# Patient Record
Sex: Female | Born: 1955 | Race: Black or African American | Hispanic: No | Marital: Married | Smoking: Never smoker
Health system: Southern US, Community
[De-identification: ages and names within clinical notes are randomized; demographics above are authoritative.]

## PROBLEM LIST (undated history)

## (undated) DIAGNOSIS — K219 Gastro-esophageal reflux disease without esophagitis: Secondary | ICD-10-CM

## (undated) DIAGNOSIS — I341 Nonrheumatic mitral (valve) prolapse: Secondary | ICD-10-CM

## (undated) DIAGNOSIS — J0100 Acute maxillary sinusitis, unspecified: Secondary | ICD-10-CM

## (undated) DIAGNOSIS — G479 Sleep disorder, unspecified: Secondary | ICD-10-CM

## (undated) DIAGNOSIS — I10 Essential (primary) hypertension: Secondary | ICD-10-CM

## (undated) DIAGNOSIS — F329 Major depressive disorder, single episode, unspecified: Secondary | ICD-10-CM

## (undated) DIAGNOSIS — F419 Anxiety disorder, unspecified: Secondary | ICD-10-CM

## (undated) DIAGNOSIS — M1990 Unspecified osteoarthritis, unspecified site: Secondary | ICD-10-CM

## (undated) DIAGNOSIS — S83289A Other tear of lateral meniscus, current injury, unspecified knee, initial encounter: Secondary | ICD-10-CM

## (undated) DIAGNOSIS — Z87442 Personal history of urinary calculi: Secondary | ICD-10-CM

## (undated) DIAGNOSIS — E7800 Pure hypercholesterolemia, unspecified: Secondary | ICD-10-CM

## (undated) DIAGNOSIS — F32A Depression, unspecified: Secondary | ICD-10-CM

## (undated) DIAGNOSIS — K120 Recurrent oral aphthae: Secondary | ICD-10-CM

## (undated) DIAGNOSIS — E669 Obesity, unspecified: Secondary | ICD-10-CM

## (undated) HISTORY — DX: Recurrent oral aphthae: K12.0

## (undated) HISTORY — DX: Pure hypercholesterolemia, unspecified: E78.00

## (undated) HISTORY — DX: Sleep disorder, unspecified: G47.9

## (undated) HISTORY — PX: LYSIS OF ADHESION: SHX5961

## (undated) HISTORY — DX: Acute maxillary sinusitis, unspecified: J01.00

## (undated) HISTORY — DX: Essential (primary) hypertension: I10

## (undated) HISTORY — DX: Nonrheumatic mitral (valve) prolapse: I34.1

## (undated) HISTORY — PX: KIDNEY STONE SURGERY: SHX686

## (undated) HISTORY — DX: Obesity, unspecified: E66.9

## (undated) HISTORY — PX: COLONOSCOPY: SHX174

## (undated) HISTORY — PX: TUBAL LIGATION: SHX77

## (undated) HISTORY — DX: Gastro-esophageal reflux disease without esophagitis: K21.9

---

## 1984-09-08 HISTORY — PX: ABDOMINAL HYSTERECTOMY: SHX81

## 2008-12-22 ENCOUNTER — Encounter: Admission: RE | Admit: 2008-12-22 | Discharge: 2008-12-22 | Payer: Self-pay | Admitting: Internal Medicine

## 2009-01-11 ENCOUNTER — Encounter: Admission: RE | Admit: 2009-01-11 | Discharge: 2009-01-11 | Payer: Self-pay | Admitting: Internal Medicine

## 2009-01-15 ENCOUNTER — Ambulatory Visit (HOSPITAL_COMMUNITY): Admission: RE | Admit: 2009-01-15 | Discharge: 2009-01-15 | Payer: Self-pay | Admitting: Internal Medicine

## 2009-01-16 ENCOUNTER — Emergency Department (HOSPITAL_COMMUNITY): Admission: EM | Admit: 2009-01-16 | Discharge: 2009-01-16 | Payer: Self-pay | Admitting: Emergency Medicine

## 2009-05-28 ENCOUNTER — Emergency Department (HOSPITAL_COMMUNITY): Admission: EM | Admit: 2009-05-28 | Discharge: 2009-05-29 | Payer: Self-pay | Admitting: Emergency Medicine

## 2009-06-13 ENCOUNTER — Ambulatory Visit (HOSPITAL_COMMUNITY): Admission: RE | Admit: 2009-06-13 | Discharge: 2009-06-13 | Payer: Self-pay | Admitting: Gastroenterology

## 2009-07-12 ENCOUNTER — Encounter: Admission: RE | Admit: 2009-07-12 | Discharge: 2009-07-12 | Payer: Self-pay | Admitting: Surgery

## 2009-09-08 HISTORY — PX: CHOLECYSTECTOMY: SHX55

## 2010-07-27 ENCOUNTER — Emergency Department (HOSPITAL_COMMUNITY): Admission: EM | Admit: 2010-07-27 | Discharge: 2010-07-28 | Payer: Self-pay | Admitting: Emergency Medicine

## 2010-09-19 ENCOUNTER — Other Ambulatory Visit
Admission: RE | Admit: 2010-09-19 | Discharge: 2010-09-19 | Payer: Self-pay | Source: Home / Self Care | Admitting: Internal Medicine

## 2010-09-23 ENCOUNTER — Encounter: Admission: RE | Admit: 2010-09-23 | Payer: Self-pay | Source: Home / Self Care | Admitting: Internal Medicine

## 2010-09-27 ENCOUNTER — Encounter
Admission: RE | Admit: 2010-09-27 | Discharge: 2010-09-27 | Payer: Self-pay | Source: Home / Self Care | Attending: Internal Medicine | Admitting: Internal Medicine

## 2010-10-09 ENCOUNTER — Encounter: Payer: Self-pay | Admitting: Internal Medicine

## 2010-12-13 LAB — HEPATIC FUNCTION PANEL
Albumin: 4 g/dL (ref 3.5–5.2)
Indirect Bilirubin: 0.5 mg/dL (ref 0.3–0.9)
Total Protein: 7.7 g/dL (ref 6.0–8.3)

## 2010-12-13 LAB — LIPASE, BLOOD: Lipase: 22 U/L (ref 11–59)

## 2010-12-13 LAB — POCT CARDIAC MARKERS
CKMB, poc: <1 ng/mL — ABNORMAL LOW (ref 1.0–8.0)
Troponin i, poc: <0.05 ng/mL (ref 0.00–0.09)
Troponin i, poc: <0.05 ng/mL (ref 0.00–0.09)

## 2010-12-13 LAB — BASIC METABOLIC PANEL
BUN: 16 mg/dL (ref 6–23)
Chloride: 101 mEq/L (ref 96–112)
Creatinine, Ser: 0.76 mg/dL (ref 0.4–1.2)
Glucose, Bld: 87 mg/dL (ref 70–99)
Potassium: 3.9 mEq/L (ref 3.5–5.1)

## 2010-12-13 LAB — CBC
HCT: 48.7 % — ABNORMAL HIGH (ref 36.0–46.0)
MCV: 85.3 fL (ref 78.0–100.0)
Platelets: 206 10*3/uL (ref 150–400)
RDW: 13.7 % (ref 11.5–15.5)

## 2010-12-13 LAB — DIFFERENTIAL
Basophils Absolute: 0.1 10*3/uL (ref 0.0–0.1)
Basophils Relative: 1 % (ref 0–1)
Eosinophils Absolute: 0.1 10*3/uL (ref 0.0–0.7)
Eosinophils Relative: 1 % (ref 0–5)
Neutrophils Relative %: 58 % (ref 43–77)

## 2010-12-13 LAB — APTT: aPTT: 25 seconds (ref 24–37)

## 2010-12-13 LAB — PROTIME-INR
INR: 1 (ref 0.00–1.49)
Prothrombin Time: 12.7 seconds (ref 11.6–15.2)

## 2010-12-17 LAB — POCT I-STAT, CHEM 8
Calcium, Ion: 1.18 mmol/L (ref 1.12–1.32)
Glucose, Bld: 99 mg/dL (ref 70–99)
HCT: 55 % — ABNORMAL HIGH (ref 36.0–46.0)
Hemoglobin: 18.7 g/dL — ABNORMAL HIGH (ref 12.0–15.0)
Potassium: 4.3 mEq/L (ref 3.5–5.1)

## 2010-12-17 LAB — DIFFERENTIAL
Basophils Relative: 1 % (ref 0–1)
Eosinophils Absolute: 0.1 10*3/uL (ref 0.0–0.7)
Lymphs Abs: 1.7 10*3/uL (ref 0.7–4.0)
Neutrophils Relative %: 61 % (ref 43–77)

## 2010-12-17 LAB — CBC
MCV: 84.7 fL (ref 78.0–100.0)
Platelets: 216 10*3/uL (ref 150–400)
WBC: 5.8 10*3/uL (ref 4.0–10.5)

## 2010-12-17 LAB — URINALYSIS, ROUTINE W REFLEX MICROSCOPIC
Bilirubin Urine: NEGATIVE
Nitrite: NEGATIVE
Specific Gravity, Urine: 1.013 (ref 1.005–1.030)
Urobilinogen, UA: 0.2 mg/dL (ref 0.0–1.0)

## 2010-12-25 ENCOUNTER — Encounter: Payer: Self-pay | Admitting: Internal Medicine

## 2011-02-19 ENCOUNTER — Other Ambulatory Visit: Payer: Self-pay | Admitting: Internal Medicine

## 2011-02-19 ENCOUNTER — Ambulatory Visit
Admission: RE | Admit: 2011-02-19 | Discharge: 2011-02-19 | Disposition: A | Discharge status: Home / Self Care | Payer: BC Managed Care – PPO | Source: Ambulatory Visit | Attending: Internal Medicine | Admitting: Internal Medicine

## 2011-02-19 DIAGNOSIS — J329 Chronic sinusitis, unspecified: Secondary | ICD-10-CM

## 2011-05-20 ENCOUNTER — Other Ambulatory Visit: Payer: Self-pay | Admitting: Internal Medicine

## 2011-05-20 DIAGNOSIS — R1010 Upper abdominal pain, unspecified: Secondary | ICD-10-CM

## 2011-05-23 ENCOUNTER — Ambulatory Visit
Admission: RE | Admit: 2011-05-23 | Discharge: 2011-05-23 | Disposition: A | Discharge status: Home / Self Care | Payer: BC Managed Care – PPO | Source: Ambulatory Visit | Attending: Internal Medicine | Admitting: Internal Medicine

## 2011-05-23 DIAGNOSIS — R1010 Upper abdominal pain, unspecified: Secondary | ICD-10-CM

## 2011-05-30 ENCOUNTER — Other Ambulatory Visit: Payer: Self-pay | Admitting: Internal Medicine

## 2011-06-02 ENCOUNTER — Ambulatory Visit
Admission: RE | Admit: 2011-06-02 | Discharge: 2011-06-02 | Disposition: A | Discharge status: Home / Self Care | Payer: BC Managed Care – PPO | Source: Ambulatory Visit | Attending: Internal Medicine | Admitting: Internal Medicine

## 2011-06-02 MED ORDER — IOHEXOL 300 MG/ML  SOLN: 75.0000 mL | Freq: Once | INTRAMUSCULAR | Status: AC | PRN

## 2012-01-08 LAB — LIPID PANEL
Cholesterol: 136 mg/dL (ref 0–200)
Triglycerides: 63 mg/dL (ref 40–160)

## 2012-01-08 LAB — TSH: TSH: 0.59 u[IU]/mL (ref <=5.90)

## 2012-09-08 HISTORY — PX: OTHER SURGICAL HISTORY: SHX169

## 2012-10-05 LAB — BASIC METABOLIC PANEL
Creatinine: 0.6 mg/dL (ref 0.5–1.1)
Sodium: 13 mmol/L — AB (ref 137–147)

## 2012-10-05 LAB — HEPATIC FUNCTION PANEL
Alkaline Phosphatase: 73 U/L (ref 25–125)
Bilirubin, Total: 0.3 mg/dL

## 2012-10-05 LAB — CBC AND DIFFERENTIAL: Platelets: 227 10*3/uL (ref 150–399)

## 2012-11-17 ENCOUNTER — Other Ambulatory Visit (HOSPITAL_COMMUNITY): Payer: Self-pay | Admitting: Cardiovascular Disease

## 2012-11-17 DIAGNOSIS — R079 Chest pain, unspecified: Secondary | ICD-10-CM

## 2012-11-17 DIAGNOSIS — I341 Nonrheumatic mitral (valve) prolapse: Secondary | ICD-10-CM

## 2012-11-25 ENCOUNTER — Emergency Department (HOSPITAL_COMMUNITY): Payer: 59

## 2012-11-25 ENCOUNTER — Encounter (HOSPITAL_COMMUNITY): Payer: Self-pay | Admitting: *Deleted

## 2012-11-25 ENCOUNTER — Emergency Department (HOSPITAL_COMMUNITY)
Admission: EM | Admit: 2012-11-25 | Discharge: 2012-11-25 | Disposition: A | Discharge status: Home / Self Care | Payer: 59 | Attending: Emergency Medicine | Admitting: Emergency Medicine

## 2012-11-25 DIAGNOSIS — M542 Cervicalgia: Secondary | ICD-10-CM | POA: Insufficient documentation

## 2012-11-25 DIAGNOSIS — S20219A Contusion of unspecified front wall of thorax, initial encounter: Secondary | ICD-10-CM | POA: Insufficient documentation

## 2012-11-25 DIAGNOSIS — IMO0002 Reserved for concepts with insufficient information to code with codable children: Secondary | ICD-10-CM | POA: Insufficient documentation

## 2012-11-25 DIAGNOSIS — S0081XA Abrasion of other part of head, initial encounter: Secondary | ICD-10-CM

## 2012-11-25 DIAGNOSIS — Z8639 Personal history of other endocrine, nutritional and metabolic disease: Secondary | ICD-10-CM | POA: Insufficient documentation

## 2012-11-25 DIAGNOSIS — Y9389 Activity, other specified: Secondary | ICD-10-CM | POA: Insufficient documentation

## 2012-11-25 DIAGNOSIS — Z862 Personal history of diseases of the blood and blood-forming organs and certain disorders involving the immune mechanism: Secondary | ICD-10-CM | POA: Insufficient documentation

## 2012-11-25 DIAGNOSIS — R209 Unspecified disturbances of skin sensation: Secondary | ICD-10-CM | POA: Insufficient documentation

## 2012-11-25 DIAGNOSIS — W2211XA Striking against or struck by driver side automobile airbag, initial encounter: Secondary | ICD-10-CM

## 2012-11-25 DIAGNOSIS — Y92410 Unspecified street and highway as the place of occurrence of the external cause: Secondary | ICD-10-CM | POA: Insufficient documentation

## 2012-11-25 DIAGNOSIS — E669 Obesity, unspecified: Secondary | ICD-10-CM | POA: Insufficient documentation

## 2012-11-25 DIAGNOSIS — Z8709 Personal history of other diseases of the respiratory system: Secondary | ICD-10-CM | POA: Insufficient documentation

## 2012-11-25 DIAGNOSIS — I10 Essential (primary) hypertension: Secondary | ICD-10-CM | POA: Insufficient documentation

## 2012-11-25 DIAGNOSIS — Z79899 Other long term (current) drug therapy: Secondary | ICD-10-CM | POA: Insufficient documentation

## 2012-11-25 MED ORDER — DIAZEPAM 5 MG PO TABS
5.0000 mg | ORAL_TABLET | Freq: Once | ORAL | Status: AC
  Administered 2012-11-25: 5 mg via ORAL
  Filled 2012-11-25: qty 1

## 2012-11-25 MED ORDER — OXYCODONE-ACETAMINOPHEN 5-325 MG PO TABS: 2.0000 | ORAL_TABLET | Freq: Four times a day (QID) | ORAL | Status: DC | PRN

## 2012-11-25 MED ORDER — DIAZEPAM 5 MG PO TABS: 5.0000 mg | ORAL_TABLET | Freq: Three times a day (TID) | ORAL | Status: DC | PRN

## 2012-11-25 MED ORDER — IBUPROFEN 800 MG PO TABS
800.0000 mg | ORAL_TABLET | Freq: Once | ORAL | Status: AC
  Administered 2012-11-25: 800 mg via ORAL
  Filled 2012-11-25: qty 1

## 2012-11-25 MED ORDER — OXYCODONE-ACETAMINOPHEN 5-325 MG PO TABS
2.0000 | ORAL_TABLET | Freq: Once | ORAL | Status: AC
  Administered 2012-11-25: 2 via ORAL
  Filled 2012-11-25: qty 2

## 2012-11-25 MED ORDER — IBUPROFEN 800 MG PO TABS: 800.0000 mg | ORAL_TABLET | Freq: Three times a day (TID) | ORAL | Status: DC
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
## 2012-12-01 ENCOUNTER — Ambulatory Visit (HOSPITAL_COMMUNITY)
Admission: RE | Admit: 2012-12-01 | Discharge: 2012-12-01 | Disposition: A | Discharge status: Home / Self Care | Payer: 59 | Source: Ambulatory Visit | Attending: Cardiovascular Disease | Admitting: Cardiovascular Disease

## 2012-12-01 DIAGNOSIS — I341 Nonrheumatic mitral (valve) prolapse: Secondary | ICD-10-CM

## 2012-12-01 DIAGNOSIS — I059 Rheumatic mitral valve disease, unspecified: Secondary | ICD-10-CM | POA: Insufficient documentation

## 2012-12-01 DIAGNOSIS — Z8249 Family history of ischemic heart disease and other diseases of the circulatory system: Secondary | ICD-10-CM | POA: Insufficient documentation

## 2012-12-01 DIAGNOSIS — E669 Obesity, unspecified: Secondary | ICD-10-CM | POA: Insufficient documentation

## 2012-12-01 DIAGNOSIS — R079 Chest pain, unspecified: Secondary | ICD-10-CM | POA: Insufficient documentation

## 2012-12-01 DIAGNOSIS — E785 Hyperlipidemia, unspecified: Secondary | ICD-10-CM | POA: Insufficient documentation

## 2012-12-01 MED ORDER — TECHNETIUM TC 99M SESTAMIBI GENERIC - CARDIOLITE
30.0000 | Freq: Once | INTRAVENOUS | Status: AC | PRN
  Administered 2012-12-01: 30 via INTRAVENOUS

## 2012-12-01 MED ORDER — TECHNETIUM TC 99M SESTAMIBI GENERIC - CARDIOLITE
10.0000 | Freq: Once | INTRAVENOUS | Status: AC | PRN
  Administered 2012-12-01: 10 via INTRAVENOUS

## 2012-12-01 NOTE — Procedures (Addendum)
[facility] [address] [place] [address] [place] [address] [phone]  Cardiology Nuclear Med Study  Victoria Craig is a 57 y.o. female     MRN : [id number]     DOB: 1955-11-26  Procedure Date: 12/01/2012  Nuclear Med Background Indication for Stress Test:  Evaluation for Ischemia History:  NO PRIOR CARDIAC HISTORY Cardiac Risk Factors: Family History - CAD, Hypertension, Lipids and Obesity  Symptoms:  Chest Pain, Dizziness, Light-Headedness and Palpitations   Nuclear Pre-Procedure Caffeine/Decaff Intake:  7:00pm NPO After: 5:00am   IV Site: R Hand  IV 0.9% NS with Angio Cath:  22g  Chest Size (in):  N/A  IV Started by: Emmit Pomfret, RN  Height: 5' (1.524 m)  Cup Size: C  BMI:  Body mass index is 40.04 kg/(m^2). Weight:  205 lb (92.987 kg)   Tech Comments:  N/A     Nuclear Med Study 1 or 2 day study: 1 day  Stress Test Type:  Stress  Order Authorizing Provider:  Nanetta Batty, MD   Resting Radionuclide: Technetium 89m Sestamibi  Resting Radionuclide Dose: 11.0 mCi   Stress Radionuclide:  Technetium 27m Sestamibi  Stress Radionuclide Dose: 30.1 mCi           Stress Protocol Rest HR: 65 Stress HR: 155  Rest BP: 100/79 Stress BP: 175/96  Exercise Time (min): 7:00 METS: 8.5   Predicted Max HR: 165 bpm % Max HR: 93.94 bpm Rate Pressure Product: 28413  Dose of Adenosine (mg):  n/a Dose of Lexiscan: n/a mg  Dose of Atropine (mg): n/a Dose of Dobutamine: n/a mcg/kg/min (at max HR)  Stress Test Technologist: Esperanza Sheets, CCT Nuclear Technologist: Koren Shiver, CNMT   Rest Procedure:  Myocardial perfusion imaging was performed at rest 45 minutes following the intravenous administration of Technetium 45m Sestamibi. Stress Procedure:  The patient performed treadmill exercise using a Bruce  Protocol for 7:00 minutes. The patient stopped due to SOB and Fatigue and denied any chest pain.  There were no significant ST-T  wave changes.  Technetium 29m Sestamibi was injected at peak exercise and myocardial perfusion imaging was performed after a brief delay.  Transient Ischemic Dilatation (Normal <1.22):  0.93 Lung/Heart Ratio (Normal <0.45):  0.35 QGS EDV:  49 ml QGS ESV:  9 ml LV Ejection Fraction: 82%  Signed by       Rest ECG: NSR - Normal EKG  Stress ECG: No significant change from baseline ECG  QPS Raw Data Images:  Normal; no motion artifact; normal heart/lung ratio. Stress Images:  Normal homogeneous uptake in all areas of the myocardium. Rest Images:  Normal homogeneous uptake in all areas of the myocardium. Subtraction (SDS):  No evidence of ischemia.  Impression Exercise Capacity:  Good exercise capacity. BP Response:  Normal blood pressure response. Clinical Symptoms:  No significant symptoms noted. ECG Impression:  No significant ST segment change suggestive of ischemia. Comparison with Prior Nuclear Study: No previous nuclear study performed  Overall Impression:  Normal stress nuclear study.  LV Wall Motion:  NL LV Function; NL Wall Motion   Runell Gess, MD  12/01/2012 1:38 PM

## 2012-12-01 NOTE — Progress Notes (Signed)
[facility]   2D echo completed 12/01/2012.   Cindy Sharell Hilmer, RDCS  

## 2012-12-09 ENCOUNTER — Ambulatory Visit (HOSPITAL_BASED_OUTPATIENT_CLINIC_OR_DEPARTMENT_OTHER): Payer: 59 | Attending: Cardiovascular Disease

## 2012-12-09 VITALS — Ht 60.0 in | Wt 195.0 lb

## 2012-12-09 DIAGNOSIS — G4761 Periodic limb movement disorder: Secondary | ICD-10-CM | POA: Insufficient documentation

## 2012-12-09 DIAGNOSIS — G4730 Sleep apnea, unspecified: Secondary | ICD-10-CM | POA: Insufficient documentation

## 2012-12-09 DIAGNOSIS — G4700 Insomnia, unspecified: Secondary | ICD-10-CM | POA: Insufficient documentation

## 2012-12-09 DIAGNOSIS — G4733 Obstructive sleep apnea (adult) (pediatric): Secondary | ICD-10-CM

## 2012-12-11 DIAGNOSIS — G4761 Periodic limb movement disorder: Secondary | ICD-10-CM

## 2012-12-11 DIAGNOSIS — G4730 Sleep apnea, unspecified: Secondary | ICD-10-CM

## 2012-12-11 DIAGNOSIS — G4700 Insomnia, unspecified: Secondary | ICD-10-CM

## 2012-12-11 NOTE — Procedures (Signed)
NAME:  Victoria Craig, Victoria Craig          ACCOUNT NO.:  [id number]  MEDICAL RECORD NO.:  [id number]          PATIENT TYPE:  OUT  LOCATION:  SLEEP CENTER                 FACILITY:  [facility]  PHYSICIAN:  Vergil Burby D. Maple Hudson, MD, FCCP, FACPDATE OF BIRTH:  July 06[birth year] 1957  DATE OF STUDY:  12/09/2012                           NOCTURNAL POLYSOMNOGRAM  REFERRING PHYSICIAN:  Nanetta Batty, M.D.  INDICATION FOR STUDY:  Insomnia with sleep apnea.  EPWORTH SLEEPINESS SCORE:  2/24.  BMI 38, weight 195 pounds, height 60 inches, neck 13 inches.  MEDICATIONS:  Home medications are charted and reviewed.  SLEEP ARCHITECTURE:  Total sleep time 347.5 minutes with sleep efficiency 95.2%.  Stage I was 6%, stage II 72.1%, stage III absent, REM 21.9% of total sleep time.  Sleep latency 5 minutes, REM latency 112 minutes.  Awake after sleep onset 11 minutes.  Arousal index 6.7 minutes.  BEDTIME MEDICATION:  Klonopin.  RESPIRATORY DATA:  Apnea-hypopnea index (AHI) 2.9 per hour.  A total of 17 events was scored including 11 obstructive apneas, 6 hypopneas.  All events were while supine.  REM AHI 11.1.  There were not enough early events to allow application of split protocol CPAP titration.  OXYGEN DATA:  Moderately loud snoring with oxygen desaturation to a nadir of 87% and mean oxygen saturation through the study of 96% on room air.  CARDIAC DATA:  Normal sinus rhythm.  MOVEMENT-PARASOMNIA:  A total of 56 limb jerks were counted, of which, 6 were associated with arousals or awakening for periodic limb movement with arousal index of 1 per hour.  No bathroom trips.  IMPRESSIONS-RECOMMENDATIONS: 1. 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  Sleep on the present night was unremarkable after     Klonopin. 2. Occasional respiratory event with sleep disturbance, within normal     limits.  AHI 2.9 per  hour (the normal range for adults is from 0-5     events per hour).  Moderately loud snoring with oxygen desaturation     to a nadir of 87% and mean oxygen saturation through the study of     96% on room air.  Respiratory events were more frequent while     sleeping supine and it may help her to encourage sleep flat on     back. 3. Mild periodic limb movement syndrome with sleep disturbance.  A     total of 56 limb jerks were counted, of     which 6 were associated with arousals or awakenings for periodic     limb movement with arousal index of 1 per hour.     Kenechukwu Eckstein D. Maple Hudson, MD, Tonny Bollman, FACP Diplomate, American Board of Sleep Medicine    CDY/MEDQ  D:  12/11/2012 15:45:25  T:  12/11/2012 23:42:05  Job:  [id number]

## 2012-12-14 ENCOUNTER — Ambulatory Visit: Payer: 59 | Attending: Family Medicine | Admitting: Physical Therapy

## 2012-12-14 DIAGNOSIS — IMO0001 Reserved for inherently not codable concepts without codable children: Secondary | ICD-10-CM | POA: Insufficient documentation

## 2012-12-14 DIAGNOSIS — M546 Pain in thoracic spine: Secondary | ICD-10-CM | POA: Insufficient documentation

## 2012-12-14 DIAGNOSIS — M545 Low back pain, unspecified: Secondary | ICD-10-CM | POA: Insufficient documentation

## 2012-12-14 DIAGNOSIS — M25539 Pain in unspecified wrist: Secondary | ICD-10-CM | POA: Insufficient documentation

## 2012-12-14 DIAGNOSIS — M542 Cervicalgia: Secondary | ICD-10-CM | POA: Insufficient documentation

## 2012-12-31 ENCOUNTER — Ambulatory Visit: Payer: 59 | Admitting: Physical Therapy

## 2013-01-14 ENCOUNTER — Emergency Department (HOSPITAL_COMMUNITY): Payer: 59

## 2013-01-14 ENCOUNTER — Emergency Department (HOSPITAL_COMMUNITY)
Admission: EM | Admit: 2013-01-14 | Discharge: 2013-01-14 | Disposition: A | Discharge status: Home / Self Care | Payer: 59 | Attending: Emergency Medicine | Admitting: Emergency Medicine

## 2013-01-14 ENCOUNTER — Encounter (HOSPITAL_COMMUNITY): Payer: Self-pay | Admitting: Emergency Medicine

## 2013-01-14 DIAGNOSIS — F0781 Postconcussional syndrome: Secondary | ICD-10-CM | POA: Insufficient documentation

## 2013-01-14 DIAGNOSIS — R5383 Other fatigue: Secondary | ICD-10-CM | POA: Insufficient documentation

## 2013-01-14 DIAGNOSIS — R5381 Other malaise: Secondary | ICD-10-CM | POA: Insufficient documentation

## 2013-01-14 DIAGNOSIS — Z87828 Personal history of other (healed) physical injury and trauma: Secondary | ICD-10-CM | POA: Insufficient documentation

## 2013-01-14 DIAGNOSIS — E7800 Pure hypercholesterolemia, unspecified: Secondary | ICD-10-CM | POA: Insufficient documentation

## 2013-01-14 DIAGNOSIS — S5001XA Contusion of right elbow, initial encounter: Secondary | ICD-10-CM

## 2013-01-14 DIAGNOSIS — G8921 Chronic pain due to trauma: Secondary | ICD-10-CM | POA: Insufficient documentation

## 2013-01-14 DIAGNOSIS — Z79899 Other long term (current) drug therapy: Secondary | ICD-10-CM | POA: Insufficient documentation

## 2013-01-14 DIAGNOSIS — I10 Essential (primary) hypertension: Secondary | ICD-10-CM | POA: Insufficient documentation

## 2013-01-14 DIAGNOSIS — S060X9D Concussion with loss of consciousness of unspecified duration, subsequent encounter: Secondary | ICD-10-CM

## 2013-01-14 DIAGNOSIS — Z8709 Personal history of other diseases of the respiratory system: Secondary | ICD-10-CM | POA: Insufficient documentation

## 2013-01-14 DIAGNOSIS — E669 Obesity, unspecified: Secondary | ICD-10-CM | POA: Insufficient documentation

## 2013-01-14 LAB — COMPREHENSIVE METABOLIC PANEL
ALT: 37 U/L — ABNORMAL HIGH (ref 0–35)
Albumin: 4.2 g/dL (ref 3.5–5.2)
Alkaline Phosphatase: 83 U/L (ref 39–117)
Potassium: 4.3 mEq/L (ref 3.5–5.1)
Sodium: 141 mEq/L (ref 135–145)
Total Protein: 8.2 g/dL (ref 6.0–8.3)

## 2013-01-14 LAB — CBC WITH DIFFERENTIAL/PLATELET
Basophils Relative: 0 % (ref 0–1)
Eosinophils Absolute: 0.2 10*3/uL (ref 0.0–0.7)
MCH: 28.3 pg (ref 26.0–34.0)
MCHC: 34.3 g/dL (ref 30.0–36.0)
Neutrophils Relative %: 52 % (ref 43–77)
Platelets: 211 10*3/uL (ref 150–400)

## 2013-01-14 MED ORDER — SODIUM CHLORIDE 0.9 % IV BOLUS (SEPSIS)
1000.0000 mL | Freq: Once | INTRAVENOUS | Status: AC
  Administered 2013-01-14: 1000 mL via INTRAVENOUS
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
## 2013-01-14 NOTE — ED Notes (Signed)
Patient transported from X-ray back to her room E-10.

## 2013-01-17 ENCOUNTER — Encounter: Payer: Self-pay | Admitting: Internal Medicine

## 2013-04-05 ENCOUNTER — Other Ambulatory Visit (HOSPITAL_COMMUNITY): Payer: Self-pay | Admitting: Orthopaedic Surgery

## 2013-04-05 DIAGNOSIS — M25521 Pain in right elbow: Secondary | ICD-10-CM

## 2013-04-08 ENCOUNTER — Ambulatory Visit (HOSPITAL_COMMUNITY)
Admission: RE | Admit: 2013-04-08 | Discharge: 2013-04-08 | Disposition: A | Discharge status: Home / Self Care | Payer: 59 | Source: Ambulatory Visit | Attending: Orthopaedic Surgery | Admitting: Orthopaedic Surgery

## 2013-04-08 DIAGNOSIS — M25521 Pain in right elbow: Secondary | ICD-10-CM

## 2013-04-08 DIAGNOSIS — M24129 Other articular cartilage disorders, unspecified elbow: Secondary | ICD-10-CM | POA: Insufficient documentation

## 2013-04-08 DIAGNOSIS — M6790 Unspecified disorder of synovium and tendon, unspecified site: Secondary | ICD-10-CM | POA: Insufficient documentation

## 2013-04-08 DIAGNOSIS — M719 Bursopathy, unspecified: Secondary | ICD-10-CM | POA: Insufficient documentation

## 2013-04-13 ENCOUNTER — Encounter (HOSPITAL_BASED_OUTPATIENT_CLINIC_OR_DEPARTMENT_OTHER): Payer: Self-pay | Admitting: *Deleted

## 2013-04-13 NOTE — Progress Notes (Signed)
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

## 2013-04-14 ENCOUNTER — Encounter (HOSPITAL_BASED_OUTPATIENT_CLINIC_OR_DEPARTMENT_OTHER)
Admission: RE | Admit: 2013-04-14 | Discharge: 2013-04-14 | Disposition: A | Discharge status: Home / Self Care | Payer: 59 | Source: Ambulatory Visit | Attending: Orthopaedic Surgery | Admitting: Orthopaedic Surgery

## 2013-04-14 LAB — BASIC METABOLIC PANEL
CO2: 27 mEq/L (ref 19–32)
Chloride: 105 mEq/L (ref 96–112)
GFR calc Af Amer: >90 mL/min (ref >90)
Potassium: 3.6 mEq/L (ref 3.5–5.1)
Sodium: 141 mEq/L (ref 135–145)

## 2013-04-15 ENCOUNTER — Ambulatory Visit (HOSPITAL_BASED_OUTPATIENT_CLINIC_OR_DEPARTMENT_OTHER)
Admission: RE | Admit: 2013-04-15 | Discharge: 2013-04-15 | Disposition: A | Discharge status: Home / Self Care | Payer: 59 | Source: Ambulatory Visit | Attending: Orthopaedic Surgery | Admitting: Orthopaedic Surgery

## 2013-04-15 ENCOUNTER — Ambulatory Visit (HOSPITAL_BASED_OUTPATIENT_CLINIC_OR_DEPARTMENT_OTHER): Payer: 59 | Admitting: Anesthesiology

## 2013-04-15 ENCOUNTER — Encounter (HOSPITAL_BASED_OUTPATIENT_CLINIC_OR_DEPARTMENT_OTHER): Payer: Self-pay | Admitting: *Deleted

## 2013-04-15 ENCOUNTER — Encounter (HOSPITAL_BASED_OUTPATIENT_CLINIC_OR_DEPARTMENT_OTHER): Payer: Self-pay | Admitting: Anesthesiology

## 2013-04-15 ENCOUNTER — Encounter (HOSPITAL_BASED_OUTPATIENT_CLINIC_OR_DEPARTMENT_OTHER)
Admission: RE | Disposition: A | Discharge status: Home / Self Care | Payer: Self-pay | Source: Ambulatory Visit | Attending: Orthopaedic Surgery

## 2013-04-15 DIAGNOSIS — S43499A Other sprain of unspecified shoulder joint, initial encounter: Secondary | ICD-10-CM | POA: Insufficient documentation

## 2013-04-15 DIAGNOSIS — E669 Obesity, unspecified: Secondary | ICD-10-CM | POA: Insufficient documentation

## 2013-04-15 DIAGNOSIS — I059 Rheumatic mitral valve disease, unspecified: Secondary | ICD-10-CM | POA: Insufficient documentation

## 2013-04-15 DIAGNOSIS — X58XXXA Exposure to other specified factors, initial encounter: Secondary | ICD-10-CM | POA: Insufficient documentation

## 2013-04-15 DIAGNOSIS — M129 Arthropathy, unspecified: Secondary | ICD-10-CM | POA: Insufficient documentation

## 2013-04-15 DIAGNOSIS — E7800 Pure hypercholesterolemia, unspecified: Secondary | ICD-10-CM | POA: Insufficient documentation

## 2013-04-15 DIAGNOSIS — F329 Major depressive disorder, single episode, unspecified: Secondary | ICD-10-CM | POA: Insufficient documentation

## 2013-04-15 DIAGNOSIS — K219 Gastro-esophageal reflux disease without esophagitis: Secondary | ICD-10-CM | POA: Insufficient documentation

## 2013-04-15 DIAGNOSIS — G479 Sleep disorder, unspecified: Secondary | ICD-10-CM | POA: Insufficient documentation

## 2013-04-15 DIAGNOSIS — M25521 Pain in right elbow: Secondary | ICD-10-CM

## 2013-04-15 DIAGNOSIS — F3289 Other specified depressive episodes: Secondary | ICD-10-CM | POA: Insufficient documentation

## 2013-04-15 DIAGNOSIS — Z79899 Other long term (current) drug therapy: Secondary | ICD-10-CM | POA: Insufficient documentation

## 2013-04-15 DIAGNOSIS — S46819A Strain of other muscles, fascia and tendons at shoulder and upper arm level, unspecified arm, initial encounter: Secondary | ICD-10-CM | POA: Insufficient documentation

## 2013-04-15 DIAGNOSIS — I10 Essential (primary) hypertension: Secondary | ICD-10-CM | POA: Insufficient documentation

## 2013-04-15 DIAGNOSIS — S46212A Strain of muscle, fascia and tendon of other parts of biceps, left arm, initial encounter: Secondary | ICD-10-CM

## 2013-04-15 HISTORY — DX: Depression, unspecified: F32.A

## 2013-04-15 HISTORY — PX: DISTAL BICEPS TENDON REPAIR: SHX1461

## 2013-04-15 HISTORY — DX: Unspecified osteoarthritis, unspecified site: M19.90

## 2013-04-15 HISTORY — DX: Major depressive disorder, single episode, unspecified: F32.9

## 2013-04-15 SURGERY — REPAIR, TENDON, BICEPS, DISTAL
Anesthesia: General | Site: Arm Upper | Laterality: Right | Wound class: Clean

## 2013-04-15 MED ORDER — CEFAZOLIN SODIUM-DEXTROSE 2-3 GM-% IV SOLR
2.0000 g | Freq: Once | INTRAVENOUS | Status: AC
  Administered 2013-04-15: 2 g via INTRAVENOUS

## 2013-04-15 MED ORDER — ONDANSETRON HCL 4 MG/2ML IJ SOLN: 4.0000 mg | Freq: Once | INTRAMUSCULAR | Status: DC | PRN

## 2013-04-15 MED ORDER — PROPOFOL 10 MG/ML IV BOLUS
INTRAVENOUS | Status: DC | PRN
  Administered 2013-04-15: 250 mg via INTRAVENOUS

## 2013-04-15 MED ORDER — ONDANSETRON HCL 4 MG/2ML IJ SOLN
INTRAMUSCULAR | Status: DC | PRN
  Administered 2013-04-15: 4 mg via INTRAVENOUS

## 2013-04-15 MED ORDER — HYDROMORPHONE HCL PF 1 MG/ML IJ SOLN: 0.2500 mg | INTRAMUSCULAR | Status: DC | PRN

## 2013-04-15 MED ORDER — OXYCODONE HCL 5 MG/5ML PO SOLN: 5.0000 mg | Freq: Once | ORAL | Status: DC | PRN

## 2013-04-15 MED ORDER — LACTATED RINGERS IV SOLN
INTRAVENOUS | Status: DC
  Administered 2013-04-15 (×3): via INTRAVENOUS

## 2013-04-15 MED ORDER — OXYCODONE HCL 5 MG PO TABS: 5.0000 mg | ORAL_TABLET | Freq: Once | ORAL | Status: DC | PRN

## 2013-04-15 MED ORDER — METHOCARBAMOL 500 MG PO TABS: 500.0000 mg | ORAL_TABLET | Freq: Four times a day (QID) | ORAL | Status: DC | PRN

## 2013-04-15 MED ORDER — FENTANYL CITRATE 0.05 MG/ML IJ SOLN
50.0000 ug | INTRAMUSCULAR | Status: DC | PRN
  Administered 2013-04-15: 100 ug via INTRAVENOUS

## 2013-04-15 MED ORDER — OXYCODONE-ACETAMINOPHEN 5-325 MG PO TABS: 2.0000 | ORAL_TABLET | ORAL | Status: DC | PRN

## 2013-04-15 MED ORDER — DEXAMETHASONE SODIUM PHOSPHATE 4 MG/ML IJ SOLN
INTRAMUSCULAR | Status: DC | PRN
  Administered 2013-04-15: 10 mg via INTRAVENOUS

## 2013-04-15 MED ORDER — LIDOCAINE HCL (CARDIAC) 20 MG/ML IV SOLN
INTRAVENOUS | Status: DC | PRN
  Administered 2013-04-15: 80 mg via INTRAVENOUS

## 2013-04-15 MED ORDER — OXYCODONE-ACETAMINOPHEN 5-325 MG PO TABS
2.0000 | ORAL_TABLET | ORAL | Status: DC | PRN
Start: 2013-04-15 — End: 2013-04-15

## 2013-04-15 MED ORDER — EPHEDRINE SULFATE 50 MG/ML IJ SOLN
INTRAMUSCULAR | Status: DC | PRN
  Administered 2013-04-15 (×2): 10 mg via INTRAVENOUS

## 2013-04-15 MED ORDER — MIDAZOLAM HCL 2 MG/2ML IJ SOLN
1.0000 mg | INTRAMUSCULAR | Status: DC | PRN
  Administered 2013-04-15: 2 mg via INTRAVENOUS

## 2013-04-15 SURGICAL SUPPLY — 63 items
ANCHOR BUTTON TIGHTROPE ABS (Orthopedic Implant) ×2 IMPLANT
BANDAGE ELASTIC 4 VELCRO ST LF (GAUZE/BANDAGES/DRESSINGS) ×2 IMPLANT
BENZOIN TINCTURE PRP APPL 2/3 (GAUZE/BANDAGES/DRESSINGS) IMPLANT
BLADE SURG 15 STRL LF DISP TIS (BLADE) ×1 IMPLANT
BLADE SURG 15 STRL SS (BLADE) ×1
BNDG ESMARK 4X9 LF (GAUZE/BANDAGES/DRESSINGS) ×2 IMPLANT
CORDS BIPOLAR (ELECTRODE) ×2 IMPLANT
COVER MAYO STAND STRL (DRAPES) ×2 IMPLANT
COVER TABLE BACK 60X90 (DRAPES) ×2 IMPLANT
DRAPE EXTREMITY T 121X128X90 (DRAPE) ×2 IMPLANT
DRAPE OEC MINIVIEW 54X84 (DRAPES) ×2 IMPLANT
DRAPE SURG 17X23 STRL (DRAPES) ×2 IMPLANT
DRAPE U-SHAPE 47X51 STRL (DRAPES) ×2 IMPLANT
DRSG PAD ABDOMINAL 8X10 ST (GAUZE/BANDAGES/DRESSINGS) IMPLANT
DURAPREP 26ML APPLICATOR (WOUND CARE) ×2 IMPLANT
ELECT REM PT RETURN 9FT ADLT (ELECTROSURGICAL) ×2
ELECTRODE REM PT RTRN 9FT ADLT (ELECTROSURGICAL) ×1 IMPLANT
GAUZE XEROFORM 1X8 LF (GAUZE/BANDAGES/DRESSINGS) IMPLANT
GLOVE BIO SURGEON STRL SZ7.5 (GLOVE) ×2 IMPLANT
GLOVE BIOGEL M 7.0 STRL (GLOVE) ×2 IMPLANT
GLOVE BIOGEL PI IND STRL 8 (GLOVE) ×1 IMPLANT
GLOVE BIOGEL PI INDICATOR 8 (GLOVE) ×1
GLOVE ECLIPSE 6.5 STRL STRAW (GLOVE) ×2 IMPLANT
GLOVE ECLIPSE 8.0 STRL XLNG CF (GLOVE) ×2 IMPLANT
GLOVE EXAM NITRILE MD LF STRL (GLOVE) ×2 IMPLANT
GOWN PREVENTION PLUS XLARGE (GOWN DISPOSABLE) ×2 IMPLANT
GOWN PREVENTION PLUS XXLARGE (GOWN DISPOSABLE) ×2 IMPLANT
INSERTER BUTTON (SYSTAGENIX WOUND MANAGEMENT) ×2 IMPLANT
LOOP VESSEL MAXI BLUE (MISCELLANEOUS) IMPLANT
NDL SUT 6 .5 CRC .975X.05 MAYO (NEEDLE) ×1 IMPLANT
NEEDLE HYPO 25X1 1.5 SAFETY (NEEDLE) IMPLANT
NEEDLE MAYO TAPER (NEEDLE) ×1
NS IRRIG 1000ML POUR BTL (IV SOLUTION) ×2 IMPLANT
PACK BASIN DAY SURGERY FS (CUSTOM PROCEDURE TRAY) ×2 IMPLANT
PAD CAST 3X4 CTTN HI CHSV (CAST SUPPLIES) ×1 IMPLANT
PAD CAST 4YDX4 CTTN HI CHSV (CAST SUPPLIES) ×1 IMPLANT
PADDING CAST ABS 4INX4YD NS (CAST SUPPLIES)
PADDING CAST ABS COTTON 4X4 ST (CAST SUPPLIES) IMPLANT
PADDING CAST COTTON 3X4 STRL (CAST SUPPLIES) ×1
PADDING CAST COTTON 4X4 STRL (CAST SUPPLIES) ×1
PENCIL BUTTON HOLSTER BLD 10FT (ELECTRODE) IMPLANT
PIN DRILL ACL TIGHTROPE 4MM (PIN) ×2 IMPLANT
SLING ARM FOAM STRAP LRG (SOFTGOODS) ×2 IMPLANT
SPLINT PLASTER CAST XFAST 3X15 (CAST SUPPLIES) IMPLANT
SPLINT PLASTER XTRA FASTSET 3X (CAST SUPPLIES)
SPONGE GAUZE 4X4 12PLY (GAUZE/BANDAGES/DRESSINGS) ×2 IMPLANT
STOCKINETTE 4X48 STRL (DRAPES) ×2 IMPLANT
STRIP CLOSURE SKIN 1/2X4 (GAUZE/BANDAGES/DRESSINGS) IMPLANT
SUCTION FRAZIER TIP 10 FR DISP (SUCTIONS) ×2 IMPLANT
SUT 2 FIBERLOOP 20 STRT BLUE (SUTURE) ×2
SUT ETHILON 4 0 PS 2 18 (SUTURE) ×2 IMPLANT
SUT FIBERWIRE #2 38 T-5 BLUE (SUTURE) ×2
SUT VIC AB 0 CT1 18XCR BRD 8 (SUTURE) ×1 IMPLANT
SUT VIC AB 0 CT1 8-18 (SUTURE) ×1
SUT VIC AB 2-0 SH 18 (SUTURE) ×2 IMPLANT
SUT VICRYL 4-0 PS2 18IN ABS (SUTURE) ×2 IMPLANT
SUTURE 2 FIBERLOOP 20 STRT BLU (SUTURE) ×1 IMPLANT
SUTURE FIBERWR #2 38 T-5 BLUE (SUTURE) ×1 IMPLANT
SYR BULB 3OZ (MISCELLANEOUS) ×2 IMPLANT
SYR CONTROL 10ML LL (SYRINGE) IMPLANT
TOWEL OR 17X24 6PK STRL BLUE (TOWEL DISPOSABLE) ×2 IMPLANT
TUBE CONNECTING 20X1/4 (TUBING) ×2 IMPLANT
UNDERPAD 30X30 INCONTINENT (UNDERPADS AND DIAPERS) ×2 IMPLANT

## 2013-04-15 NOTE — Interval H&P Note (Signed)
History and Physical Interval Note:  04/15/2013 7:21 AM  Victoria Craig  has presented today for surgery, with the diagnosis of Right Distal Biceps Tendon Partial Rupture  The various methods of treatment have been discussed with the patient and family. After consideration of risks, benefits and other options for treatment, the patient has consented to  Procedure(s) with comments: DISTAL BICEPS TENDON REPAIR (Right) - Right Distal Biceps Tendon Repair as a surgical intervention .  The patient's history has been reviewed, patient examined, no change in status, stable for surgery.  I have reviewed the patient's chart and labs.  Questions were answered to the patient's satisfaction.     [name]

## 2013-04-15 NOTE — Op Note (Signed)
Test test  Preop diagnosis: Right elbow distal biceps tendon rupture  Postop diagnosis: Same  Procedure: Repair right distal biceps tendon rupture.  Surgeon: Annell Greening M.D.  Anesthesia: Preoperative block plus general anesthesia  Tourniquet: Less than 1 hour x250 mm pressure  Complications: None  Brief history: This 47 show female has had several week history of increasing elbow pain to the point where she cannot use her hand to go the bathroom get dressed or lift any object that weighed more than a pencil. She has severe pain has been holding her arm across her chest and MRI scan showed near complete biceps tendon distal rupture. There were still a few fibers intact.  Procedure: After induction general anesthesia with the preoperative block before this 2 g Ancef was given prophylactically proximal arm tourniquet prepping with DuraPrep once it was dried timeout procedure was completed usual stockinette extremity sheets and drapes were applied. Incision was made starting at the antecubital space and extending over the volar surface distally in line of the biceps tendon. Biceps tendon was palpable and was identified it proximally to the incision. The arcade was divided and median nerve is identified. Brachial radialis was elevated and the biceps tendon was followed down to the biceps tuberosity of the radius. Ulnar fractures are placed on each side care was taken not to apply excess tension. There was only about 10-20% of the tendon still attached. There was so fascial sheath around the tendon the tendon was scarred down all adjacent structures with the clear severe serous fluid present. Once the sheath was opened and the fluid was suctioned and removed the thin remaining portion of the tendon that was still partially attached was present. This was peeled off with the for your elevator and then tendon was thinned whipstitch with #2 FiberWire suture using a Keith needle to incise through an 8 mm  sizer. A tight rope was placed by original dry hole in the tuberosity bicortically overreaming with a millimeter unicortical he directly into the biceps the tuberosity. The end of the suture with whipstitch the came out the tip the tendon a sizer was then passed through the tight rope with a standard 2 loops Thyrolar was placed through the hole button was advanced by pulling back on the bone with the thumb in and pushing in the foot the tight rope. His pulled flush with the bone tendon was advanced alternating pressure with the tendon going into the hall and draped visualization and once tendon was not down the opposite cortex C-arm was brought in and been draped confirm that the tight rope metal piece it flipped was sitting down next the cortex. Free needle was used to pass with the stitch through the tendon and was tightened down securely with multiple knots. Running suture was cut L. was able to come out full extension full flexion Noris compression. Tourniquet was deflated the arcade showed no evidence of bleeding small veins min coagulated looked good wounds irrigated copiously I was flexed and extended again checking. Median nerve root normal there is no excessive bleeding and only a trace amount of blood came out through the hole that had been drilled in the radius since the tendon had such a nice tight fit and was applying some compression. Subtendinous tissue a proximal 2-0 Vicryl for Vicryl subcuticular closure tincture benzoin Steri-Strips postop dressing and long-arm splint was applied. Patient aren't procedure well she had a block which was still working and she had no pain in her care removal and  will be discharged home .signed Vernon Prey.D.

## 2013-04-15 NOTE — Progress Notes (Signed)
Assisted Dr. Crews with right, ultrasound guided, supraclavicular block. Side rails up, monitors on throughout procedure. See vital signs in flow sheet. Tolerated Procedure well. 

## 2013-04-15 NOTE — H&P (Signed)
Victoria Craig is an 57 y.o. female.   Chief Complaint: right distal biceps tendon rupture HPI: 57 yo female with 6 wks Hx of progressive right antecubital pain and severe pain with light elbow flexion. Progressed to where she cannot use her right dominant hand for bathroom use, dressing, eating and has kept her arm across her chest. MRI shows right distal biceps tendon near complete rupture.   Past Medical History  Diagnosis Date  . Essential hypertension, malignant   . Pure hypercholesterolemia   . Acute maxillary sinusitis   . Obesity, unspecified   . Mitral valve prolapse   . GERD (gastroesophageal reflux disease)   . Aphthous ulcer   . Sleep disturbance   . Depression   . Arthritis     Past Surgical History  Procedure Laterality Date  . Tubal ligation    . Colonoscopy    . Cholecystectomy  2011  . Cesarean section  [id number]  . Abdominal hysterectomy  1986    History reviewed. No pertinent family history. Social History:  reports that she has never smoked. She does not have any smokeless tobacco history on file. She reports that  drinks alcohol. She reports that she does not use illicit drugs.  Allergies: No Known Allergies  Medications Prior to Admission  Medication Sig Dispense Refill  . cholecalciferol (VITAMIN D) 1000 UNITS tablet Take 1,000 Units by mouth daily.      . diazepam (VALIUM) 5 MG tablet Take 5 mg by mouth every 8 (eight) hours as needed (muscle spasms). For muscle spasms      . diltiazem (CARDIZEM) 120 MG tablet Take 120 mg by mouth every morning.       Marland Kitchen estradiol (VIVELLE-DOT) 0.1 MG/24HR Place 1 patch onto the skin 2 (two) times a week. New patch on Thursday and Monday      . ibuprofen (ADVIL,MOTRIN) 800 MG tablet Take 800 mg by mouth 3 (three) times daily as needed. For pain      . metoprolol succinate (TOPROL-XL) 25 MG 24 hr tablet Take 25 mg by mouth daily.      Marland Kitchen omeprazole (PRILOSEC) 20 MG capsule Take 20 mg by mouth daily.      . sertraline  (ZOLOFT) 100 MG tablet Take 100 mg by mouth daily.      Marland Kitchen triamterene-hydrochlorothiazide (DYAZIDE) 37.5-25 MG per capsule Take 1 capsule by mouth daily.       Marland Kitchen zolpidem (AMBIEN) 5 MG tablet Take 5 mg by mouth at bedtime as needed. For sleep      . amLODipine (NORVASC) 10 MG tablet Take 10 mg by mouth daily.        Results for orders placed during the hospital encounter of 04/15/13 (from the past 48 hour(s))  BASIC METABOLIC PANEL     Status: None   Collection Time    04/14/13  9:24 AM      Result Value Range   Sodium 141  135 - 145 mEq/L   Potassium 3.6  3.5 - 5.1 mEq/L   Chloride 105  96 - 112 mEq/L   CO2 27  19 - 32 mEq/L   Glucose, Bld 86  70 - 99 mg/dL   BUN 16  6 - 23 mg/dL   Creatinine, Ser 1.61  0.50 - 1.10 mg/dL   Calcium 9.8  8.4 - 09.6 mg/dL   GFR calc non Af Amer >90  >90 mL/min   GFR calc Af Amer >90  >90 mL/min   Comment:  The eGFR has been calculated     using the CKD EPI equation.     This calculation has not been     validated in all clinical     situations.     eGFR's persistently     <90 mL/min signify     possible Chronic Kidney Disease.   No results found.  Review of Systems  Constitutional: Negative.  Negative for fever and chills.  HENT: Negative for neck pain and tinnitus.   Eyes: Negative for blurred vision.  Respiratory: Negative for cough.   Cardiovascular: Negative for chest pain and orthopnea.  Gastrointestinal: Negative for nausea and abdominal pain.  Genitourinary: Negative for frequency and hematuria.  Musculoskeletal: Negative for back pain.  Skin: Negative for rash.  Neurological: Negative for dizziness and headaches.  Endo/Heme/Allergies: Does not bruise/bleed easily.  Psychiatric/Behavioral: Negative.     Blood pressure 115/57, pulse 65, temperature 98.5 F (36.9 C), temperature source Oral, resp. rate 20, height 5\' 1"  (1.549 m), weight 96.163 kg (212 lb), SpO2 99.00%. Physical Exam  Constitutional: She appears  well-developed and well-nourished.  HENT:  Head: Normocephalic and atraumatic.  Eyes: EOM are normal. Pupils are equal, round, and reactive to light.  Neck: Normal range of motion.  Cardiovascular: Normal rate.   Respiratory: Effort normal.  GI: Soft.  Musculoskeletal:  Right elbow 20 to 110 ROM with severe pain with biceps tendon palpation in antecubital space.    median ulnar , radial sensory and motor intact.   Assessment/Plan Right distal biceps tendon rupture for repair. Risks of nerve injury, hand numbness, weakness, re-rupture, infection  Discussed. She understands and requests we proceed.   Victoria Craig C 04/15/2013, 7:16 AM

## 2013-04-15 NOTE — Transfer of Care (Signed)
Immediate Anesthesia Transfer of Care Note  Patient: Victoria Craig  Procedure(s) Performed: Procedure(s) with comments: DISTAL BICEPS TENDON REPAIR (Right) - Right Distal Biceps Tendon Repair  Patient Location: PACU  Anesthesia Type:General and Regional  Level of Consciousness: awake, alert  and oriented  Airway & Oxygen Therapy: Patient Spontanous Breathing and Patient connected to face mask oxygen  Post-op Assessment: Report given to PACU RN and Post -op Vital signs reviewed and stable  Post vital signs: Reviewed and stable  Complications: No apparent anesthesia complications

## 2013-04-15 NOTE — Anesthesia Procedure Notes (Addendum)
Procedure Name: LMA Insertion Performed by: York Grice Pre-anesthesia Checklist: Patient identified, Timeout performed, Emergency Drugs available, Suction available and Patient being monitored Patient Re-evaluated:Patient Re-evaluated prior to inductionOxygen Delivery Method: Circle system utilized Preoxygenation: Pre-oxygenation with 100% oxygen Intubation Type: IV induction Ventilation: Mask ventilation without difficulty LMA: LMA with gastric port inserted LMA Size: 4.0 Number of attempts: 1 Placement Confirmation: breath sounds checked- equal and bilateral and positive ETCO2 Tube secured with: Tape Dental Injury: Teeth and Oropharynx as per pre-operative assessment       Narrative:

## 2013-04-15 NOTE — Brief Op Note (Signed)
04/15/2013  9:51 AM  PATIENT:  Marilynn Rail  57 y.o. female  PRE-OPERATIVE DIAGNOSIS:  Right Distal Biceps Tendon Partial Rupture  POST-OPERATIVE DIAGNOSIS:  Right Distal Biceps Tendon Partial Rupture  PROCEDURE:  Procedure(s) with comments: DISTAL BICEPS TENDON REPAIR (Right) - Right Distal Biceps Tendon Repair  SURGEON:  Surgeon(s) and Role:    * Eldred Manges, MD - Primary  PHYSICIAN ASSISTANT:   ASSISTANTS: none   ANESTHESIA:   regional and general  EBL:  Total I/O In: 1400 [I.V.:1400] Out: -   BLOOD ADMINISTERED:none  DRAINS: none   LOCAL MEDICATIONS USED:  NONE  SPECIMEN:  No Specimen  DISPOSITION OF SPECIMEN:  N/A  COUNTS:  YES  TOURNIQUET:   Total Tourniquet Time Documented: Upper Arm (Right) - 57 minutes Total: Upper Arm (Right) - 57 minutes   DICTATION: .Reubin Milan Dictation  PLAN OF CARE: Discharge to home after PACU  PATIENT DISPOSITION:  PACU - hemodynamically stable.   Delay start of Pharmacological VTE agent (>24hrs) due to surgical blood loss or risk of bleeding: not applicable

## 2013-04-15 NOTE — Anesthesia Postprocedure Evaluation (Signed)
  Anesthesia Post-op Note  Patient: Victoria Craig  Procedure(s) Performed: Procedure(s) with comments: DISTAL BICEPS TENDON REPAIR (Right) - Right Distal Biceps Tendon Repair  Patient Location: PACU  Anesthesia Type:GA combined with regional for post-op pain  Level of Consciousness: awake, alert  and oriented  Airway and Oxygen Therapy: Patient Spontanous Breathing  Post-op Pain: none  Post-op Assessment: Post-op Vital signs reviewed  Post-op Vital Signs: Reviewed  Complications: No apparent anesthesia complications

## 2013-04-15 NOTE — Anesthesia Preprocedure Evaluation (Signed)
Anesthesia Evaluation  Patient identified by MRN, date of birth, ID band Patient awake    Reviewed: Allergy & Precautions, H&P , NPO status , Patient's Chart, lab work & pertinent test results  Airway Mallampati: I TM Distance: >3 FB Neck ROM: Full    Dental  (+) Teeth Intact and Dental Advisory Given   Pulmonary  breath sounds clear to auscultation        Cardiovascular hypertension, Rhythm:Regular Rate:Normal     Neuro/Psych    GI/Hepatic GERD-  Medicated,  Endo/Other    Renal/GU      Musculoskeletal   Abdominal   Peds  Hematology   Anesthesia Other Findings   Reproductive/Obstetrics                           Anesthesia Physical Anesthesia Plan  ASA: II  Anesthesia Plan: General   Post-op Pain Management:    Induction: Intravenous  Airway Management Planned: LMA  Additional Equipment:   Intra-op Plan:   Post-operative Plan: Extubation in OR  Informed Consent: I have reviewed the patients History and Physical, chart, labs and discussed the procedure including the risks, benefits and alternatives for the proposed anesthesia with the patient or authorized representative who has indicated his/her understanding and acceptance.   Dental advisory given  Plan Discussed with: CRNA, Anesthesiologist and Surgeon  Anesthesia Plan Comments:         Anesthesia Quick Evaluation

## 2013-04-18 ENCOUNTER — Encounter (HOSPITAL_BASED_OUTPATIENT_CLINIC_OR_DEPARTMENT_OTHER): Payer: Self-pay | Admitting: Orthopaedic Surgery

## 2013-07-14 ENCOUNTER — Other Ambulatory Visit: Payer: Self-pay

## 2013-11-18 ENCOUNTER — Other Ambulatory Visit (HOSPITAL_COMMUNITY): Payer: Self-pay | Admitting: Cardiovascular Disease

## 2014-03-21 ENCOUNTER — Other Ambulatory Visit (HOSPITAL_COMMUNITY): Payer: Self-pay | Admitting: Cardiovascular Disease

## 2014-03-23 NOTE — Telephone Encounter (Signed)
Rx was sent to pharmacy electronically. 

## 2014-05-04 ENCOUNTER — Other Ambulatory Visit (HOSPITAL_COMMUNITY): Payer: Self-pay | Admitting: Orthopaedic Surgery

## 2014-05-05 ENCOUNTER — Encounter (HOSPITAL_COMMUNITY): Payer: Self-pay | Admitting: Pharmacy Technician

## 2014-05-05 ENCOUNTER — Encounter (HOSPITAL_COMMUNITY): Payer: Self-pay | Admitting: *Deleted

## 2014-05-05 NOTE — H&P (Signed)
[facility]   A Division of [employer] [facility]   [address] [place], [place] [address] Telephone: [phone]  Fax: [phone]     PATIENT: Victoria Craig, Victoria Craig   MR#: [id number]  DOB: 04-24-1956       A 58 year old female with sharp pain that radiates down her forearm into her hand.  She states she has had some shooting pain, has had catching, triggering over her ring finger and has had a popliteal mass over the midpalm region which is exquisitely tender, grabs her whenever she tries to reach anything, grabs, squeeze.  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  Anything that she grabs with her right hand her pain has been severe with the catching of the ring finger.     CURRENT MEDICATIONS:   Her medications are reviewed and updated, unchanged from 2012 including triamterene/hydrochlorothiazide, atorvastatin, metoprolol, sertraline, Zolpidem, Xyzal, diltiazem and ibuprofen p.r.n.     No change in surgical history, family history.  Social history is unchanged.  She is here with her husband [name].     REVIEW OF SYSTEMS:   A 14-point review of systems positive for anxiety, bronchitis, hypertension.   PHYSICAL EXAMINATION:  The patient is alert and oriented.  Extraocular movements intact.  No supraclavicular lymphadenopathy.  No brachial plexus tenderness. heart and lung sounds clear. Shoulder range of motion shows no impingement.  Elbow reach full extension.  There is a palpable nodule proximal A1 pulley which is 3 x 2 cm exquisitely tender.  She withdraws and cries out with palpation over the A1 pulley with flexion.  The DIP, PIP joint the finger catches.  No tenderness over the A1 pulley.  Other digits capillary refill is intact, extensor function is intact.   ASSESSMENT:  Right ring trigger finger with likely ganglion of the tendon sheath just proximal to the A1 pulley.       PLAN:  We discussed options including injection.  She states she would like to proceed with the operative intervention.  Procedure discussed.  We will set this up as an outpatient.  She will be out of work for a few days with a small dressing and then can apply a Band-Aid to cover the stitches in a couple of days and resume work activities if she so desires.   For additional information please see handwritten notes, reports, orders and prescriptions in this chart.      Mark C. Lorin Mercy, M.D.    Auto-Authenticated by Thana Farr. Lorin Mercy, M.D.

## 2014-05-07 MED ORDER — CEFAZOLIN SODIUM-DEXTROSE 2-3 GM-% IV SOLR
2.0000 g | INTRAVENOUS | Status: AC
  Administered 2014-05-08: 2 g via INTRAVENOUS
  Filled 2014-05-07: qty 50

## 2014-05-07 NOTE — Anesthesia Preprocedure Evaluation (Addendum)
Anesthesia Evaluation  Patient identified by MRN, date of birth, ID band Patient awake    Reviewed: Allergy & Precautions, H&P , NPO status , Patient's Chart, lab work & pertinent test results, reviewed documented beta blocker date and time   Airway Mallampati: II TM Distance: >3 FB Neck ROM: Full    Dental  (+) Teeth Intact, Dental Advisory Given   Pulmonary Sleep apnea: presumed,  based on BMI and Hg of 16. ,  Pt denies OSA breath sounds clear to auscultation        Cardiovascular hypertension, Pt. on medications and Pt. on home beta blockers Rhythm:Regular Rate:Normal  EF 3/14 65% no MVP noted   Neuro/Psych    GI/Hepatic GERD-  Medicated and Controlled,  Endo/Other    Renal/GU      Musculoskeletal   Abdominal (+)  Abdomen: soft.    Peds  Hematology   Anesthesia Other Findings   Reproductive/Obstetrics                       Anesthesia Physical Anesthesia Plan  ASA: III  Anesthesia Plan: MAC   Post-op Pain Management:    Induction: Intravenous  Airway Management Planned:   Additional Equipment:   Intra-op Plan:   Post-operative Plan:   Informed Consent: I have reviewed the patients History and Physical, chart, labs and discussed the procedure including the risks, benefits and alternatives for the proposed anesthesia with the patient or authorized representative who has indicated his/her understanding and acceptance.     Plan Discussed with:   Anesthesia Plan Comments:         Anesthesia Quick Evaluation

## 2014-05-08 ENCOUNTER — Ambulatory Visit (HOSPITAL_COMMUNITY)
Admission: RE | Admit: 2014-05-08 | Discharge: 2014-05-08 | Disposition: A | Discharge status: Home / Self Care | Payer: 59 | Source: Ambulatory Visit | Attending: Orthopaedic Surgery | Admitting: Orthopaedic Surgery

## 2014-05-08 ENCOUNTER — Ambulatory Visit (HOSPITAL_COMMUNITY): Payer: 59 | Admitting: Anesthesiology

## 2014-05-08 ENCOUNTER — Encounter (HOSPITAL_COMMUNITY)
Admission: RE | Disposition: A | Discharge status: Home / Self Care | Payer: Self-pay | Source: Ambulatory Visit | Attending: Orthopaedic Surgery

## 2014-05-08 ENCOUNTER — Encounter (HOSPITAL_COMMUNITY): Payer: 59 | Admitting: Anesthesiology

## 2014-05-08 ENCOUNTER — Ambulatory Visit (HOSPITAL_COMMUNITY): Payer: 59

## 2014-05-08 DIAGNOSIS — M6530 Trigger finger, unspecified finger: Secondary | ICD-10-CM | POA: Diagnosis present

## 2014-05-08 DIAGNOSIS — M6740 Ganglion, unspecified site: Secondary | ICD-10-CM | POA: Insufficient documentation

## 2014-05-08 HISTORY — PX: TRIGGER FINGER RELEASE: SHX641

## 2014-05-08 LAB — CBC
HCT: 44.3 % (ref 36.0–46.0)
Hemoglobin: 14.8 g/dL (ref 12.0–15.0)
MCH: 27.3 pg (ref 26.0–34.0)
MCHC: 33.4 g/dL (ref 30.0–36.0)
MCV: 81.7 fL (ref 78.0–100.0)
PLATELETS: 201 10*3/uL (ref 150–400)
RBC: 5.42 MIL/uL — ABNORMAL HIGH (ref 3.87–5.11)
RDW: 14.3 % (ref 11.5–15.5)
WBC: 5.7 10*3/uL (ref 4.0–10.5)

## 2014-05-08 LAB — BASIC METABOLIC PANEL
Anion gap: 14 (ref 5–15)
BUN: 20 mg/dL (ref 6–23)
CALCIUM: 9.1 mg/dL (ref 8.4–10.5)
CO2: 23 mEq/L (ref 19–32)
Chloride: 105 mEq/L (ref 96–112)
Creatinine, Ser: 0.69 mg/dL (ref 0.50–1.10)
GFR calc Af Amer: >90 mL/min (ref >90)
GLUCOSE: 92 mg/dL (ref 70–99)
Potassium: 3.9 mEq/L (ref 3.7–5.3)
Sodium: 142 mEq/L (ref 137–147)

## 2014-05-08 SURGERY — RELEASE, A1 PULLEY, FOR TRIGGER FINGER
Anesthesia: Monitor Anesthesia Care | Site: Hand | Laterality: Right

## 2014-05-08 MED ORDER — LIDOCAINE HCL (PF) 1 % IJ SOLN
INTRAMUSCULAR | Status: AC
  Filled 2014-05-08: qty 30

## 2014-05-08 MED ORDER — FENTANYL CITRATE 0.05 MG/ML IJ SOLN
INTRAMUSCULAR | Status: AC
  Filled 2014-05-08: qty 5

## 2014-05-08 MED ORDER — LIDOCAINE HCL 1 % IJ SOLN
INTRAMUSCULAR | Status: DC | PRN
  Administered 2014-05-08: 10:00:00

## 2014-05-08 MED ORDER — MIDAZOLAM HCL 5 MG/5ML IJ SOLN
INTRAMUSCULAR | Status: DC | PRN
  Administered 2014-05-08: 0.5 mg via INTRAVENOUS

## 2014-05-08 MED ORDER — PROMETHAZINE HCL 25 MG/ML IJ SOLN
6.2500 mg | INTRAMUSCULAR | Status: DC | PRN
Start: 2014-05-08 — End: 2014-05-08

## 2014-05-08 MED ORDER — HYDROCODONE-ACETAMINOPHEN 5-325 MG PO TABS: 2.0000 | ORAL_TABLET | ORAL | Status: DC | PRN

## 2014-05-08 MED ORDER — LIDOCAINE HCL (CARDIAC) 20 MG/ML IV SOLN
INTRAVENOUS | Status: DC | PRN
  Administered 2014-05-08: 75 mg via INTRAVENOUS

## 2014-05-08 MED ORDER — ONDANSETRON HCL 4 MG/2ML IJ SOLN
INTRAMUSCULAR | Status: DC | PRN
Start: 2014-05-08 — End: 2014-05-08
  Administered 2014-05-08: 4 mg via INTRAVENOUS

## 2014-05-08 MED ORDER — MIDAZOLAM HCL 2 MG/2ML IJ SOLN
INTRAMUSCULAR | Status: AC
  Filled 2014-05-08: qty 2

## 2014-05-08 MED ORDER — PROPOFOL 10 MG/ML IV BOLUS
INTRAVENOUS | Status: DC | PRN
  Administered 2014-05-08: 100 mg via INTRAVENOUS

## 2014-05-08 MED ORDER — KETOROLAC TROMETHAMINE 30 MG/ML IJ SOLN
INTRAMUSCULAR | Status: DC | PRN
  Administered 2014-05-08: 30 mg via INTRAVENOUS

## 2014-05-08 MED ORDER — LACTATED RINGERS IV SOLN
INTRAVENOUS | Status: DC | PRN
  Administered 2014-05-08: 08:00:00 via INTRAVENOUS

## 2014-05-08 MED ORDER — ONDANSETRON HCL 4 MG/2ML IJ SOLN
INTRAMUSCULAR | Status: AC
  Filled 2014-05-08: qty 2

## 2014-05-08 MED ORDER — PROPOFOL 10 MG/ML IV BOLUS
INTRAVENOUS | Status: AC
  Filled 2014-05-08: qty 20

## 2014-05-08 MED ORDER — MEPERIDINE HCL 25 MG/ML IJ SOLN
6.2500 mg | INTRAMUSCULAR | Status: DC | PRN
Start: 2014-05-08 — End: 2014-05-08

## 2014-05-08 MED ORDER — LIDOCAINE HCL (CARDIAC) 20 MG/ML IV SOLN
INTRAVENOUS | Status: AC
Start: 2014-05-08 — End: 2014-05-08
  Filled 2014-05-08: qty 5

## 2014-05-08 MED ORDER — DEXTROSE 5 % IV SOLN
INTRAVENOUS | Status: DC | PRN
  Administered 2014-05-08: 09:00:00 via INTRAVENOUS

## 2014-05-08 MED ORDER — 0.9 % SODIUM CHLORIDE (POUR BTL) OPTIME
TOPICAL | Status: DC | PRN
  Administered 2014-05-08: 1000 mL

## 2014-05-08 MED ORDER — FENTANYL CITRATE 0.05 MG/ML IJ SOLN: 25.0000 ug | INTRAMUSCULAR | Status: DC | PRN

## 2014-05-08 MED ORDER — FENTANYL CITRATE 0.05 MG/ML IJ SOLN
INTRAMUSCULAR | Status: DC | PRN
  Administered 2014-05-08: 25 ug via INTRAVENOUS

## 2014-05-08 MED ORDER — BUPIVACAINE HCL (PF) 0.25 % IJ SOLN
INTRAMUSCULAR | Status: AC
Start: 2014-05-08 — End: 2014-05-08
  Filled 2014-05-08: qty 30

## 2014-05-08 MED ORDER — PROPOFOL 10 MG/ML IV BOLUS
INTRAVENOUS | Status: AC
Start: 2014-05-08 — End: 2014-05-08
  Filled 2014-05-08: qty 20

## 2014-05-08 SURGICAL SUPPLY — 51 items
BANDAGE ELASTIC 4 VELCRO ST LF (GAUZE/BANDAGES/DRESSINGS) ×3 IMPLANT
BENZOIN TINCTURE PRP APPL 2/3 (GAUZE/BANDAGES/DRESSINGS) IMPLANT
BLADE SURG ROTATE 9660 (MISCELLANEOUS) IMPLANT
BNDG COHESIVE 1X5 TAN STRL LF (GAUZE/BANDAGES/DRESSINGS) IMPLANT
BNDG CONFORM 3 STRL LF (GAUZE/BANDAGES/DRESSINGS) IMPLANT
BNDG ELASTIC 2 VLCR STRL LF (GAUZE/BANDAGES/DRESSINGS) IMPLANT
BNDG ESMARK 4X9 LF (GAUZE/BANDAGES/DRESSINGS) ×3 IMPLANT
CLOSURE WOUND 1/2 X4 (GAUZE/BANDAGES/DRESSINGS)
CORDS BIPOLAR (ELECTRODE) ×3 IMPLANT
COVER SURGICAL LIGHT HANDLE (MISCELLANEOUS) ×3 IMPLANT
CUFF TOURNIQUET SINGLE 18IN (TOURNIQUET CUFF) IMPLANT
CUFF TOURNIQUET SINGLE 24IN (TOURNIQUET CUFF) IMPLANT
DRSG EMULSION OIL 3X3 NADH (GAUZE/BANDAGES/DRESSINGS) IMPLANT
DURAPREP 26ML APPLICATOR (WOUND CARE) ×3 IMPLANT
ELECT REM PT RETURN 9FT ADLT (ELECTROSURGICAL) ×3
ELECTRODE REM PT RTRN 9FT ADLT (ELECTROSURGICAL) ×1 IMPLANT
GAUZE SPONGE 2X2 8PLY STRL LF (GAUZE/BANDAGES/DRESSINGS) IMPLANT
GAUZE SPONGE 4X4 12PLY STRL (GAUZE/BANDAGES/DRESSINGS) IMPLANT
GAUZE XEROFORM 1X8 LF (GAUZE/BANDAGES/DRESSINGS) ×3 IMPLANT
GLOVE BIOGEL PI IND STRL 8 (GLOVE) ×1 IMPLANT
GLOVE BIOGEL PI INDICATOR 8 (GLOVE) ×2
GLOVE ORTHO TXT STRL SZ7.5 (GLOVE) ×3 IMPLANT
GOWN STRL REUS W/ TWL LRG LVL3 (GOWN DISPOSABLE) ×2 IMPLANT
GOWN STRL REUS W/TWL LRG LVL3 (GOWN DISPOSABLE) ×4
KIT BASIN OR (CUSTOM PROCEDURE TRAY) ×3 IMPLANT
KIT ROOM TURNOVER OR (KITS) ×3 IMPLANT
MANIFOLD NEPTUNE II (INSTRUMENTS) ×3 IMPLANT
NS IRRIG 1000ML POUR BTL (IV SOLUTION) ×3 IMPLANT
PACK ORTHO EXTREMITY (CUSTOM PROCEDURE TRAY) ×3 IMPLANT
PAD ARMBOARD 7.5X6 YLW CONV (MISCELLANEOUS) ×6 IMPLANT
PAD CAST 4YDX4 CTTN HI CHSV (CAST SUPPLIES) ×1 IMPLANT
PADDING CAST COTTON 4X4 STRL (CAST SUPPLIES) ×2
SPECIMEN JAR SMALL (MISCELLANEOUS) ×3 IMPLANT
SPONGE GAUZE 2X2 STER 10/PKG (GAUZE/BANDAGES/DRESSINGS)
SPONGE GAUZE 4X4 12PLY STER LF (GAUZE/BANDAGES/DRESSINGS) ×3 IMPLANT
STRIP CLOSURE SKIN 1/2X4 (GAUZE/BANDAGES/DRESSINGS) IMPLANT
SUCTION FRAZIER TIP 10 FR DISP (SUCTIONS) IMPLANT
SUT ETHIBOND 4 0 TF (SUTURE) ×6 IMPLANT
SUT ETHIBOND 5 0 P 3 (SUTURE) ×2
SUT ETHILON 4 0 P 3 18 (SUTURE) ×3 IMPLANT
SUT ETHILON 5 0 P 3 18 (SUTURE) ×2
SUT MERSILENE 6 0 S14 DA (SUTURE) ×3 IMPLANT
SUT NYLON ETHILON 5-0 P-3 1X18 (SUTURE) ×1 IMPLANT
SUT POLY ETHIBOND 5-0 P-3 1X18 (SUTURE) ×1 IMPLANT
SUT PROLENE 4 0 P 3 18 (SUTURE) ×3 IMPLANT
SUT SILK 4 0 PS 2 (SUTURE) ×3 IMPLANT
TOWEL OR 17X24 6PK STRL BLUE (TOWEL DISPOSABLE) ×3 IMPLANT
TOWEL OR 17X26 10 PK STRL BLUE (TOWEL DISPOSABLE) ×3 IMPLANT
TUBE CONNECTING 12'X1/4 (SUCTIONS)
TUBE CONNECTING 12X1/4 (SUCTIONS) IMPLANT
WATER STERILE IRR 1000ML POUR (IV SOLUTION) ×3 IMPLANT

## 2014-05-08 NOTE — Anesthesia Postprocedure Evaluation (Signed)
  Anesthesia Post-op Note  Patient: Victoria Craig  Procedure(s) Performed: Procedure(s): Excision Right Palm Mass, Trigger Finger Release Right Ring Finger (Right)  Patient Location: PACU  Anesthesia Type:General  Level of Consciousness: alert  and oriented  Airway and Oxygen Therapy: Patient Spontanous Breathing  Post-op Pain: mild  Post-op Assessment: Post-op Vital signs reviewed, Patient's Cardiovascular Status Stable, Respiratory Function Stable, No signs of Nausea or vomiting and Pain level controlled  Post-op Vital Signs: Reviewed and stable  Last Vitals:  Filed Vitals:   05/08/14 1043  BP:   Pulse:   Temp: 36.4 C  Resp:     Complications: No apparent anesthesia complications

## 2014-05-08 NOTE — Anesthesia Procedure Notes (Signed)
Procedure Name: LMA Insertion Date/Time: 05/08/2014 9:08 AM Performed by: Terrill Mohr Pre-anesthesia Checklist: Emergency Drugs available, Patient identified, Suction available and Patient being monitored Patient Re-evaluated:Patient Re-evaluated prior to inductionOxygen Delivery Method: Circle system utilized Preoxygenation: Pre-oxygenation with 100% oxygen Intubation Type: IV induction Ventilation: Mask ventilation without difficulty LMA: LMA inserted LMA Size: 4.0 Number of attempts: 1 Placement Confirmation: breath sounds checked- equal and bilateral and positive ETCO2 ETT to lip (cm): taped across cheeks. Tube secured with: Tape Dental Injury: Teeth and Oropharynx as per pre-operative assessment

## 2014-05-08 NOTE — Discharge Instructions (Addendum)
Hand elevated, ice to palm for pain for first 48 hrs as needed.  May remove dressing at 48 hrs wash hand and apply band aid to incision.   What to eat:  For your first meals, you should eat lightly; only small meals initially.  If you do not have nausea, you may eat larger meals.  Avoid spicy, greasy and heavy food.    General Anesthesia, Adult, Care After  Refer to this sheet in the next few weeks. These instructions provide you with information on caring for yourself after your procedure. Your health care provider may also give you more specific instructions. Your treatment has been planned according to current medical practices, but problems sometimes occur. Call your health care provider if you have any problems or questions after your procedure.  WHAT TO EXPECT AFTER THE PROCEDURE  After the procedure, it is typical to experience:  Sleepiness.  Nausea and vomiting. HOME CARE INSTRUCTIONS  For the first 24 hours after general anesthesia:  Have a responsible person with you.  Do not drive a car. If you are alone, do not take public transportation.  Do not drink alcohol.  Do not take medicine that has not been prescribed by your health care provider.  Do not sign important papers or make important decisions.  You may resume a normal diet and activities as directed by your health care provider.  Change bandages (dressings) as directed.  If you have questions or problems that seem related to general anesthesia, call the hospital and ask for the anesthetist or anesthesiologist on call. SEEK MEDICAL CARE IF:  You have nausea and vomiting that continue the day after anesthesia.  You develop a rash. SEEK IMMEDIATE MEDICAL CARE IF:  You have difficulty breathing.  You have chest pain.  You have any allergic problems. Document Released: 12/01/2000 Document Revised: 04/27/2013 Document Reviewed: 03/10/2013  [facility] Patient Information 2014 [place], [place].

## 2014-05-08 NOTE — Brief Op Note (Signed)
05/08/2014  9:43 AM  PATIENT:  Victoria Craig  58 y.o. female  PRE-OPERATIVE DIAGNOSIS:  Right Ring Trigger Finger with Ganglion Mass  POST-OPERATIVE DIAGNOSIS:  Right Ring Trigger Finger with Ganglion Mass  PROCEDURE:  Procedure(s): Excision Right Palm Mass, Trigger Finger Release Right Ring Finger (Right)  SURGEON:  Surgeon(s) and Role:    * Marybelle Killings, MD - Primary  PHYSICIAN ASSISTANT:   ASSISTANTS: none   ANESTHESIA:   general  EBL:  Total I/O In: 50 [I.V.:50] Out: -   BLOOD ADMINISTERED:none  DRAINS: none   LOCAL MEDICATIONS USED:  MARCAINE    and XYLOCAINE   SPECIMEN:  No Specimen  DISPOSITION OF SPECIMEN:  N/A  COUNTS:  YES  TOURNIQUET:  * Missing tourniquet times found for documented tourniquets in log:  [id number] * Total Tourniquet Time Documented: Upper Arm (Right) - 8 minutes Total: Upper Arm (Right) - 8 minutes   DICTATION: .Other Dictation: Dictation Number [id number]  PLAN OF CARE: Discharge to home after PACU  PATIENT DISPOSITION:  PACU - hemodynamically stable.   Delay start of Pharmacological VTE agent (>24hrs) due to surgical blood loss or risk of bleeding: not applicable

## 2014-05-08 NOTE — Interval H&P Note (Signed)
History and Physical Interval Note:  05/08/2014 8:49 AM  Victoria Craig  has presented today for surgery, with the diagnosis of Right Ring Trigger Finger with Ganglion Mass  The various methods of treatment have been discussed with the patient and family. After consideration of risks, benefits and other options for treatment, the patient has consented to  Procedure(s): Excision Right Palm Mass, Trigger Finger Release Right Ring Finger (Right) as a surgical intervention .  The patient's history has been reviewed, patient examined, no change in status, stable for surgery.  I have reviewed the patient's chart and labs.  Questions were answered to the patient's satisfaction.     Quinn Quam C

## 2014-05-08 NOTE — Transfer of Care (Signed)
Immediate Anesthesia Transfer of Care Note  Patient: Victoria Craig  Procedure(s) Performed: Procedure(s): Excision Right Palm Mass, Trigger Finger Release Right Ring Finger (Right)  Patient Location: PACU  Anesthesia Type:General  Level of Consciousness: awake, alert , oriented and patient cooperative  Airway & Oxygen Therapy: Patient Spontanous Breathing and Patient connected to nasal cannula oxygen  Post-op Assessment: Report given to PACU RN, Post -op Vital signs reviewed and stable and Patient moving all extremities  Post vital signs: Reviewed and stable  Complications: No apparent anesthesia complications

## 2014-05-08 NOTE — Discharge Summary (Signed)
  Outpatient surgery not admitted.

## 2014-05-09 ENCOUNTER — Encounter (HOSPITAL_COMMUNITY): Payer: Self-pay | Admitting: Orthopaedic Surgery

## 2014-05-09 NOTE — Op Note (Signed)
NAMEDARRAH, Victoria Craig          ACCOUNT NO.:  [id number]  MEDICAL RECORD NO.:  [id number]  LOCATION:  [facility]                         FACILITY:  [facility]  PHYSICIAN:  Lowana Hable C. Lorin Mercy, M.D.    DATE OF BIRTH:  01/24/1956  DATE OF PROCEDURE:  05/08/2014 DATE OF DISCHARGE:  05/08/2014                              OPERATIVE REPORT   PREOPERATIVE DIAGNOSIS:  Right ring trigger finger with ganglion of the tendon sheath.  POSTOPERATIVE DIAGNOSIS:  Right ring trigger finger with ganglion of the tendon sheath.  PROCEDURE:  Right ring finger A1 pulley division and excision of ganglion of the tendon sheath.  SURGEON:  Treniyah Lynn C. Lorin Mercy, MD  ANESTHESIA:  General LMA plus 3 mL Marcaine local.  A 58 year old female with severe triggering on the finger to the point where she could not flex it.  At that time, she woke up with the finger flexed, had mainly reduced, that had a palpable nodule proximal A1 pulley which was exquisitely painful when she suddenly would grab and squeeze objects.  PROCEDURE DESCRIPTION:  After placement of LMA tube, proximal arm tourniquet, prepping with DuraPrep, usual extremity sheets and drapes were applied.  Transverse incision with skin marker was made.  Time-out procedure completed.  Hand was elevated, wrapped with an Esmarch tourniquet, tourniquet inflated to 250.  A 2 cm incision was made just proximal to the A1 pulley and just distal to the palpable ganglion. Blunt dissection in the midline was performed.  Ragnell retractors were placed directly over the A1 pulley.  Senn retractor double-end was placed proximally and the ganglion was visualized just to the proximal aspect of the A1 pulley.  Then it was 2-3 cm in size and would slide with the tendon.  It was grasped with pickups, excised with the tip of Stevens scissors.  Typical ganglion fluid was present.  A1 pulley was divided, nicking with a scalpel blade, and then extending with tenotomy scissors, proximal and  distal under direct visualization until the A1 pulley was completely released.  Finger was taken through range of motion.  There was no longer triggering with the ganglion excised.  No masses of the tendon and tourniquet deflation at 7 minutes.  Hemostasis with bipolar cautery and then skin closure with 4-0 nylon.  Xeroform, 4x4s, twiners, Webril, and Ace wrap were applied for postoperative dressing, outpatient surgeons, appropriate treatment in this condition, also to follow up in 1 week.     Keltie Labell C. Lorin Mercy, M.D.     MCY/MEDQ  D:  05/08/2014  T:  05/09/2014  Job:  [id number]

## 2014-06-15 ENCOUNTER — Other Ambulatory Visit (HOSPITAL_COMMUNITY): Payer: Self-pay | Admitting: Cardiovascular Disease

## 2014-06-19 NOTE — Telephone Encounter (Signed)
Rx was sent to pharmacy electronically. Last OV 12/2012 

## 2014-07-27 ENCOUNTER — Emergency Department (HOSPITAL_COMMUNITY): Payer: 59

## 2014-07-27 ENCOUNTER — Encounter (HOSPITAL_COMMUNITY): Payer: Self-pay | Admitting: *Deleted

## 2014-07-27 ENCOUNTER — Emergency Department (HOSPITAL_COMMUNITY)
Admission: EM | Admit: 2014-07-27 | Discharge: 2014-07-27 | Disposition: A | Discharge status: Home / Self Care | Payer: 59 | Attending: Emergency Medicine | Admitting: Emergency Medicine

## 2014-07-27 DIAGNOSIS — S79912A Unspecified injury of left hip, initial encounter: Secondary | ICD-10-CM | POA: Diagnosis not present

## 2014-07-27 DIAGNOSIS — E669 Obesity, unspecified: Secondary | ICD-10-CM | POA: Diagnosis not present

## 2014-07-27 DIAGNOSIS — Z8709 Personal history of other diseases of the respiratory system: Secondary | ICD-10-CM | POA: Diagnosis not present

## 2014-07-27 DIAGNOSIS — Y998 Other external cause status: Secondary | ICD-10-CM | POA: Insufficient documentation

## 2014-07-27 DIAGNOSIS — M1990 Unspecified osteoarthritis, unspecified site: Secondary | ICD-10-CM | POA: Insufficient documentation

## 2014-07-27 DIAGNOSIS — S299XXA Unspecified injury of thorax, initial encounter: Secondary | ICD-10-CM | POA: Insufficient documentation

## 2014-07-27 DIAGNOSIS — I341 Nonrheumatic mitral (valve) prolapse: Secondary | ICD-10-CM | POA: Diagnosis not present

## 2014-07-27 DIAGNOSIS — Y92410 Unspecified street and highway as the place of occurrence of the external cause: Secondary | ICD-10-CM | POA: Insufficient documentation

## 2014-07-27 DIAGNOSIS — I10 Essential (primary) hypertension: Secondary | ICD-10-CM | POA: Insufficient documentation

## 2014-07-27 DIAGNOSIS — Z8669 Personal history of other diseases of the nervous system and sense organs: Secondary | ICD-10-CM | POA: Insufficient documentation

## 2014-07-27 DIAGNOSIS — K219 Gastro-esophageal reflux disease without esophagitis: Secondary | ICD-10-CM | POA: Diagnosis not present

## 2014-07-27 DIAGNOSIS — F329 Major depressive disorder, single episode, unspecified: Secondary | ICD-10-CM | POA: Diagnosis not present

## 2014-07-27 DIAGNOSIS — Z79899 Other long term (current) drug therapy: Secondary | ICD-10-CM | POA: Diagnosis not present

## 2014-07-27 DIAGNOSIS — S199XXA Unspecified injury of neck, initial encounter: Secondary | ICD-10-CM | POA: Insufficient documentation

## 2014-07-27 DIAGNOSIS — Y9389 Activity, other specified: Secondary | ICD-10-CM | POA: Diagnosis not present

## 2014-07-27 DIAGNOSIS — S3991XA Unspecified injury of abdomen, initial encounter: Secondary | ICD-10-CM | POA: Diagnosis not present

## 2014-07-27 MED ORDER — HYDROCODONE-ACETAMINOPHEN 5-325 MG PO TABS: 1.0000 | ORAL_TABLET | Freq: Four times a day (QID) | ORAL | Status: DC | PRN

## 2014-07-27 MED ORDER — HYDROMORPHONE HCL 1 MG/ML IJ SOLN
1.0000 mg | Freq: Once | INTRAMUSCULAR | Status: AC
  Administered 2014-07-27: 1 mg via INTRAMUSCULAR
  Filled 2014-07-27: qty 1

## 2014-07-27 MED ORDER — CYCLOBENZAPRINE HCL 10 MG PO TABS: 5.0000 mg | ORAL_TABLET | Freq: Three times a day (TID) | ORAL | Status: DC | PRN

## 2014-07-27 MED ORDER — ONDANSETRON HCL 4 MG PO TABS
4.0000 mg | ORAL_TABLET | Freq: Once | ORAL | Status: AC
  Administered 2014-07-27: 4 mg via ORAL
  Filled 2014-07-27: qty 1

## 2014-07-27 MED ORDER — ACETAMINOPHEN 325 MG PO TABS
650.0000 mg | ORAL_TABLET | Freq: Once | ORAL | Status: AC
  Administered 2014-07-27: 650 mg via ORAL
  Filled 2014-07-27: qty 2
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
## 2014-07-27 NOTE — Discharge Instructions (Signed)

## 2014-07-27 NOTE — ED Notes (Signed)
Patient with c/o 8/10 to multiple pain sites: RUQ and RLQ, neck, right side of chest EKG obtained Patient also with c/o nausea Patient given ODT Zofran C-collar remains in place Patient alert and oriented x 4

## 2014-07-27 NOTE — ED Notes (Signed)
Bed: WA14 Expected date:  Expected time:  Means of arrival:  Comments: EMS 

## 2014-07-28 ENCOUNTER — Other Ambulatory Visit (HOSPITAL_COMMUNITY): Payer: Self-pay | Admitting: Cardiovascular Disease

## 2014-07-31 NOTE — Telephone Encounter (Signed)
Rx denied,note to pharmacy asking to send to PCP. patient has not been seen or made appointment

## 2014-08-17 ENCOUNTER — Ambulatory Visit: Payer: 59 | Attending: Orthopaedic Surgery

## 2014-08-17 ENCOUNTER — Telehealth: Payer: Self-pay | Admitting: *Deleted

## 2014-08-17 DIAGNOSIS — M542 Cervicalgia: Secondary | ICD-10-CM | POA: Diagnosis not present

## 2014-08-17 DIAGNOSIS — T148XXA Other injury of unspecified body region, initial encounter: Secondary | ICD-10-CM

## 2014-08-17 DIAGNOSIS — M792 Neuralgia and neuritis, unspecified: Secondary | ICD-10-CM | POA: Insufficient documentation

## 2014-08-17 NOTE — Addendum Note (Signed)
Addended by: Darrel Hoover on: 08/17/2014 09:47 AM   Modules accepted: Orders

## 2014-08-17 NOTE — Patient Instructions (Signed)
She was asked to work on relaxation and genltle stretching                                                                                                                                                                                                         Posture Tips DO: - stand tall and erect - keep chin tucked in - keep head and shoulders in alignment - check posture regularly in mirror or large window - pull head back against headrest in car seat;  Change your position often.  Sit with lumbar support. DON'T: - slouch or slump while watching TV or reading - sit, stand or lie in one position  for too long;  Sitting is especially hard on the spine so if you sit at a desk/use the computer, then stand up often!   Copyright  VHI. All rights reserved.  Posture - Standing   Good posture is important. Avoid slouching and forward head thrust. Maintain curve in low back and align ears over shoul- ders, hips over ankles.  Pull your belly button in toward your back bone.   Copyright  VHI. All rights reserved.  Posture - Sitting   Sit upright, head facing forward. Try using a roll to support lower back. Keep shoulders relaxed, and avoid rounded back. Keep hips level with knees. Avoid crossing legs for long periods.   Copyright  VHI. All rights reserved.
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
## 2014-08-17 NOTE — Telephone Encounter (Signed)
appts made and printed...td 

## 2014-08-21 ENCOUNTER — Ambulatory Visit: Payer: 59 | Admitting: Physical Therapy

## 2014-08-21 DIAGNOSIS — T148XXA Other injury of unspecified body region, initial encounter: Secondary | ICD-10-CM

## 2014-08-21 DIAGNOSIS — M542 Cervicalgia: Secondary | ICD-10-CM

## 2014-08-21 NOTE — Therapy (Signed)
Outpatient Rehabilitation [facility] [address] [place], [place], [address] Phone: [phone]   Fax:  [phone]  Physical Therapy Treatment  Patient Details  Name: Victoria Craig MRN: [id number] Date of Birth: 1956-04-13  Encounter Date: 08/21/2014      PT End of Session - 08/21/14 1546    Visit Number 2   Number of Visits 12   Date for PT Re-Evaluation 09/28/14   PT Start Time 1525   PT Stop Time 1605   PT Time Calculation (min) 40 min   Activity Tolerance Patient tolerated treatment well      Past Medical History  Diagnosis Date  . Essential hypertension, malignant   . Pure hypercholesterolemia   . Acute maxillary sinusitis   . Obesity, unspecified   . Mitral valve prolapse   . GERD (gastroesophageal reflux disease)   . Aphthous ulcer   . Sleep disturbance   . Depression   . Arthritis     Past Surgical History  Procedure Laterality Date  . Tubal ligation    . Colonoscopy    . Cholecystectomy  2011  . Cesarean section  [id number]  . Abdominal hysterectomy  1986  . Distal biceps tendon repair Right 04/15/2013    Procedure: DISTAL BICEPS TENDON REPAIR;  Surgeon: Marybelle Killings, MD;  Location: [facility];  Service: Orthopedics;  Laterality: Right;  Right Distal Biceps Tendon Repair  . Trigger finger release Right 05/08/2014    Procedure: Excision Right Palm Mass, Trigger Finger Release Right Ring Finger;  Surgeon: Marybelle Killings, MD;  Location: [facility];  Service: Orthopedics;  Laterality: Right;    There were no vitals taken for this visit.  Visit Diagnosis:  Cervicalgia  Contusion      Subjective Assessment - 08/21/14 1542    Symptoms Arrived early, but not arrived in system until 1539.  Lt neck shoulder and arm pain continue.  Not yet driving.   Pain Score 6    Pain Location Neck   Pain Orientation Left   Pain Descriptors / Indicators Aching;Burning   Multiple Pain Sites No            [facility] Adult PT Treatment/Exercise -  08/21/14 0001    Moist Heat Therapy   Number Minutes Moist Heat 20 Minutes   Moist Heat Location --  Neck and back   Electrical Stimulation   Electrical Stimulation Location --  neck   Electrical Stimulation Action IFC   Electrical Stimulation Parameters 5   Electrical Stimulation Goals Pain                Plan - 08/21/14 1547    Clinical Impression Statement Modalities only  due to shortened session.   PT Next Visit Plan gentle range, Manual, modalities   Consulted and Agree with Plan of Care Patient                               Problem List Patient Active Problem List   Diagnosis Date Noted  . Trigger finger, right 05/08/2014  . Rupture of left biceps tendon 04/15/2013   Melvenia Needles, PTA 08/21/2014 3:49 PM Phone: [phone] Fax: [phone]   [facility] 08/21/2014, 3:49 PM

## 2014-08-21 NOTE — Patient Instructions (Signed)
Warm neck tissues in shower then gently stretch.  Avoid activities that cause radiating arm pain.

## 2014-08-23 ENCOUNTER — Ambulatory Visit: Payer: 59

## 2014-08-23 DIAGNOSIS — M542 Cervicalgia: Secondary | ICD-10-CM

## 2014-08-23 DIAGNOSIS — T148XXA Other injury of unspecified body region, initial encounter: Secondary | ICD-10-CM

## 2014-08-23 DIAGNOSIS — M792 Neuralgia and neuritis, unspecified: Secondary | ICD-10-CM

## 2014-08-23 NOTE — Therapy (Signed)
[facility] [address] [place], [place], [address] Phone: [phone]   Fax:  [phone]  Physical Therapy Treatment  Patient Details  Name: Victoria Craig MRN: [id number] Date of Birth: July 14, 1956  Encounter Date: 08/23/2014    Past Medical History  Diagnosis Date  . Essential hypertension, malignant   . Pure hypercholesterolemia   . Acute maxillary sinusitis   . Obesity, unspecified   . Mitral valve prolapse   . GERD (gastroesophageal reflux disease)   . Aphthous ulcer   . Sleep disturbance   . Depression   . Arthritis     Past Surgical History  Procedure Laterality Date  . Tubal ligation    . Colonoscopy    . Cholecystectomy  2011  . Cesarean section  [id number]  . Abdominal hysterectomy  1986  . Distal biceps tendon repair Right 04/15/2013    Procedure: DISTAL BICEPS TENDON REPAIR;  Surgeon: Marybelle Killings, MD;  Location: [place];  Service: Orthopedics;  Laterality: Right;  Right Distal Biceps Tendon Repair  . Trigger finger release Right 05/08/2014    Procedure: Excision Right Palm Mass, Trigger Finger Release Right Ring Finger;  Surgeon: Marybelle Killings, MD;  Location: [place];  Service: Orthopedics;  Laterality: Right;    There were no vitals taken for this visit.  Visit Diagnosis:  Cervicalgia  Contusion  Radicular pain in left arm      Subjective Assessment - 08/23/14 0751    Symptoms Today her pain is 6/10 without pain meds. She does not appear in distress. She noticed a knot in scalp.  She is better40%.  She has more pain post PT.     Currently in Pain? Yes   Pain Score 6    Pain Location Neck   Pain Orientation Left   Pain Descriptors / Indicators Aching;Burning   Pain Type Acute pain   Pain Onset 1 to 4 weeks ago   Pain Frequency Constant   Aggravating Factors  Moving neck and arm   Pain Relieving Factors Rest and medicaiton   Multiple Pain Sites No            [facility] Adult PT  Treatment/Exercise - 08/23/14 0752    Modalities   Modalities Ultrasound   Moist Heat Therapy   Number Minutes Moist Heat 20 Minutes   Moist Heat Location --  neck and upper back   Electrical Stimulation   Electrical Stimulation Location neck   Electrical Stimulation Action IFC   Electrical Stimulation Goals Pain   Ultrasound   Ultrasound Location TL neck   Ultrasound Parameters 100% IMHZ 1.6 Wcm2   Manual Therapy   Manual Therapy Myofascial release;Massage  with light touch to LT neck and shoulder   Massage Soft tissue work to LT neck and shoulder with geltle stretching with sidebending                Plan - 08/23/14 0830    Clinical Impression Statement She continues with only minor tighness LT neck but with higher levels of pain. Will continue wiht pain control and add some stabilization exercise   PT Next Visit Plan gentle range, Manual, modalities   PT Home Exercise Plan Cervical range, posture , stab exer   Consulted and Agree with Plan of Care Patient                               Problem List Patient Active Problem  List   Diagnosis Date Noted  . Trigger finger, right 05/08/2014  . Rupture of left biceps tendon 04/15/2013    Darrel Hoover PT 08/23/2014, 8:32 AM

## 2014-08-28 ENCOUNTER — Encounter: Payer: Self-pay | Admitting: Physical Therapy

## 2014-08-30 ENCOUNTER — Ambulatory Visit: Payer: 59 | Admitting: Rehabilitation

## 2014-08-30 DIAGNOSIS — M542 Cervicalgia: Secondary | ICD-10-CM

## 2014-08-30 DIAGNOSIS — M792 Neuralgia and neuritis, unspecified: Secondary | ICD-10-CM

## 2014-08-30 DIAGNOSIS — T148XXA Other injury of unspecified body region, initial encounter: Secondary | ICD-10-CM

## 2014-08-30 NOTE — Therapy (Signed)
[place], [place], [address] Phone: [phone]   Fax:  [phone]  Physical Therapy Treatment  Patient Details  Name: Anuja Manka MRN: [id number] Date of Birth: May 22, 1956  Encounter Date: 08/30/2014      PT End of Session - 08/30/14 0811    Visit Number 3   Number of Visits 12   Date for PT Re-Evaluation 09/28/14   PT Start Time 0732   PT Stop Time 0815   PT Time Calculation (min) 43 min      Past Medical History  Diagnosis Date  . Essential hypertension, malignant   . Pure hypercholesterolemia   . Acute maxillary sinusitis   . Obesity, unspecified   . Mitral valve prolapse   . GERD (gastroesophageal reflux disease)   . Aphthous ulcer   . Sleep disturbance   . Depression   . Arthritis     Past Surgical History  Procedure Laterality Date  . Tubal ligation    . Colonoscopy    . Cholecystectomy  2011  . Cesarean section  [id number]  . Abdominal hysterectomy  1986  . Distal biceps tendon repair Right 04/15/2013    Procedure: DISTAL BICEPS TENDON REPAIR;  Surgeon: Marybelle Killings, MD;  Location: [place];  Service: Orthopedics;  Laterality: Right;  Right Distal Biceps Tendon Repair  . Trigger finger release Right 05/08/2014    Procedure: Excision Right Palm Mass, Trigger Finger Release Right Ring Finger;  Surgeon: Marybelle Killings, MD;  Location: [place];  Service: Orthopedics;  Laterality: Right;    There were no vitals taken for this visit.  Visit Diagnosis:  Cervicalgia  Contusion  Radicular pain in left arm      Subjective Assessment - 08/30/14 0735    Symptoms 7/10 left upper trap down left arm with achy wrist and stinging in upper arm. She states is almost brought tears yesterday   Currently in Pain? Yes   Pain Score 7    Pain Location Neck  shoulder and arm   Pain Orientation Left   Pain Descriptors / Indicators Aching  stinging   Pain Type Acute pain   Pain  Frequency Constant   Aggravating Factors  moving LUE , wrapping presents   Pain Relieving Factors rest medication   Multiple Pain Sites No                    [facility] Adult PT Treatment/Exercise - 08/30/14 0846    Neck Exercises: Stretches   Upper Trapezius Stretch 3 reps;10 seconds   Levator Stretch 3 reps;10 seconds   Neck Exercises: Seated   Neck Retraction 10 reps;5 secs   Other Seated Exercise Scap retraction 5 sec x10   Modalities   Modalities Ultrasound   Moist Heat Therapy   Number Minutes Moist Heat 15 Minutes   Moist Heat Location --  neck and upper back   Electrical Stimulation   Electrical Stimulation Location neck   Electrical Stimulation Action IF   Electrical Stimulation Parameters 9   Electrical Stimulation Goals Pain   Ultrasound   Ultrasound Location Left upper trap   Ultrasound Parameters 100%, 1.6w/cm2, 1MHZ                PT Education - 08/30/14 [phone]    Education provided Yes   Education Details HEP, did not print HEP   Person(s) Educated Patient   Methods Explanation   Comprehension Verbalized understanding  PT Short Term Goals - 08/30/14 0844    PT SHORT TERM GOAL #1   Title Pt report pain decr 25% or mroe    Time 3   Period Weeks   Status Achieved   PT SHORT TERM GOAL #2   Title Pt will verbalize understanding of good posture   Time 3   Period Weeks   Status Achieved   PT SHORT TERM GOAL #3   Title Pt will report 25% less need for keeping arm held to body   Time 3   Period Weeks   Status On-going           PT Long Term Goals - 08/17/14 8118    PT LONG TERM GOAL #1   Title independent with HEP issued up to last visit   Time 6   Period Weeks   Status New   PT LONG TERM GOAL #2   Title PT will report pain decrease 75% ro more   Time 6   Period Weeks   Status New   PT LONG TERM GOAL #3   Title PT will not hold her LT arm to body for comfort   Time 6   Period Weeks   Status New   PT LONG TERM  GOAL #4   Title Cervical rotation equal RT to LT   Time 6   Period Weeks   Status New   PT LONG TERM GOAL #5   Title She will report improvement of symptoms of dizzness by 75%   Time 6   Period Weeks   Status New               Plan - 08/30/14 0844    Clinical Impression Statement STG#1,2 MET   PT Next Visit Plan check cervical ROM, manuall STW left upper trap, levator, review and reprint HEP        Problem List Patient Active Problem List   Diagnosis Date Noted  . Trigger finger, right 05/08/2014  . Rupture of left biceps tendon 04/15/2013    Dorene Ar, [place] 08/30/2014, 8:48 AM  [place], [place], [address] Phone: [phone]   Fax:  [phone]

## 2014-08-30 NOTE — Patient Instructions (Signed)
Levator Stretch   Grasp seat or sit on hand on side to be stretched. Turn head toward other side and look down. Use hand on head to gently stretch neck in that position. Hold __10__ seconds. Repeat on other side. Repeat __3__ times. Do __2__ sessions per day.  http://gt2.exer.us/30   Copyright  VHI. All rights reserved.  Side-Bending   One hand on opposite side of head, pull head to side as far as is comfortable. Stop if there is pain. Hold _10___ seconds. Repeat with other hand to other side. Repeat __3__ times. Do ___2_ sessions per day.   Copyright  VHI. All rights reserved.  Scapular Retraction (Standing)   With arms at sides, pinch shoulder blades together. HOLD 5 sec Repeat __10__ times per set. Do ____ sets per session. Do __2__ sessions per day.  http://orth.exer.us/944   Copyright  VHI. All rights reserved.  Chin Protraction / Retraction   Slide head forward keeping chin level. Slide head back, pulling chin in. Hold each position _5__ seconds. Repeat _10__ times. Do __2_ sessions per day.  Copyright  VHI. All rights reserved.

## 2014-08-31 ENCOUNTER — Ambulatory Visit: Payer: 59 | Admitting: Physical Therapy

## 2014-08-31 DIAGNOSIS — M542 Cervicalgia: Secondary | ICD-10-CM

## 2014-08-31 DIAGNOSIS — M792 Neuralgia and neuritis, unspecified: Secondary | ICD-10-CM

## 2014-08-31 NOTE — Therapy (Signed)
[place], [place], [address] Phone: [phone]   Fax:  [phone]  Physical Therapy Treatment  Patient Details  Name: Victoria Craig MRN: [id number] Date of Birth: 11/21/1955  Encounter Date: 08/31/2014      PT End of Session - 08/31/14 1137    Visit Number 4   Number of Visits 12   Date for PT Re-Evaluation 09/28/14   PT Start Time 0850   PT Stop Time 0935   PT Time Calculation (min) 45 min   Activity Tolerance Patient tolerated treatment well;Patient limited by pain      Past Medical History  Diagnosis Date  . Essential hypertension, malignant   . Pure hypercholesterolemia   . Acute maxillary sinusitis   . Obesity, unspecified   . Mitral valve prolapse   . GERD (gastroesophageal reflux disease)   . Aphthous ulcer   . Sleep disturbance   . Depression   . Arthritis     Past Surgical History  Procedure Laterality Date  . Tubal ligation    . Colonoscopy    . Cholecystectomy  2011  . Cesarean section  [id number]  . Abdominal hysterectomy  1986  . Distal biceps tendon repair Right 04/15/2013    Procedure: DISTAL BICEPS TENDON REPAIR;  Surgeon: Marybelle Killings, MD;  Location: [place];  Service: Orthopedics;  Laterality: Right;  Right Distal Biceps Tendon Repair  . Trigger finger release Right 05/08/2014    Procedure: Excision Right Palm Mass, Trigger Finger Release Right Ring Finger;  Surgeon: Marybelle Killings, MD;  Location: [place];  Service: Orthopedics;  Laterality: Right;    There were no vitals taken for this visit.  Visit Diagnosis:  Cervicalgia  Radicular pain in left arm      Subjective Assessment - 08/31/14 0853    Symptoms patient pain 6/10 after last session, had feeling that electrodes were still on, then that faded and pain came on more than 10.  I wanted to cry.  Arm feels swollen. Feels it is getting worse   Currently in Pain? Yes   Pain Score 6   10 +/10  yesterday.   Pain Location --  Lt upper trap   Pain Orientation Left   Pain Descriptors / Indicators Aching  Stinging   Pain Type Acute pain   Pain Onset 1 to 4 weeks ago   Aggravating Factors  not sure, PT?   Pain Relieving Factors medication   Effect of Pain on Daily Activities Limits   Multiple Pain Sites No                    [facility] Adult PT Treatment/Exercise - 08/31/14 0903    Modalities   Modalities Ultrasound  8 minutes, 100%, 1.3 watts/cm2, upper back, nech shoulder.     Manual :myofascial work to neck ,upper back and posterior shoulder.  Tension reduced, arm, hand symptoms relieved. Then kinesiotex taping applied to upper back, neck.  2 fans crossing applied no tension with patient in posterior pelvic tilt and neck flexion. Also smaller fan applied anterior chest for supra clavicular edema reduction.Neck rotated Lt with neck extension, applied with no tension.           PT Education - 08/30/14 [phone]    Education provided Yes   Education Details HEP, did not print HEP   Person(s) Educated Patient   Methods Explanation   Comprehension Verbalized understanding  PT Short Term Goals - 08/30/14 0844    PT SHORT TERM GOAL #1   Title Pt report pain decr 25% or mroe    Time 3   Period Weeks   Status Achieved   PT SHORT TERM GOAL #2   Title Pt will verbalize understanding of good posture   Time 3   Period Weeks   Status Achieved   PT SHORT TERM GOAL #3   Title Pt will report 25% less need for keeping arm held to body   Time 3   Period Weeks   Status On-going           PT Long Term Goals - 08/17/14 9935    PT LONG TERM GOAL #1   Title independent with HEP issued up to last visit   Time 6   Period Weeks   Status New   PT LONG TERM GOAL #2   Title PT will report pain decrease 75% ro more   Time 6   Period Weeks   Status New   PT LONG TERM GOAL #3   Title PT will not hold her LT arm to body for comfort   Time 6   Period Weeks    Status New   PT LONG TERM GOAL #4   Title Cervical rotation equal RT to LT   Time 6   Period Weeks   Status New   PT LONG TERM GOAL #5   Title She will report improvement of symptoms of dizzness by 75%   Time 6   Period Weeks   Status New               Plan - 08/31/14 1138    Clinical Impression Statement Symptoms in Rt arm and hand relieved with myofascial release.  Taping applied to assist with accumulated edema in upperback and neck.    PT Next Visit Plan Check ROM, continue myofascial work. assess taping.   Consulted and Agree with Plan of Care Patient        Problem List Patient Active Problem List   Diagnosis Date Noted  . Trigger finger, right 05/08/2014  . Rupture of left biceps tendon 04/15/2013  Melvenia Needles, PTA 08/31/2014 11:50 AM Phone: [phone] Fax: [phone]   [facility] 08/31/2014, 11:50 AM  [place], [place], [address] Phone: [phone]   Fax:  [phone]

## 2014-09-11 ENCOUNTER — Ambulatory Visit: Payer: 59 | Attending: Orthopaedic Surgery | Admitting: Physical Therapy

## 2014-09-11 DIAGNOSIS — M792 Neuralgia and neuritis, unspecified: Secondary | ICD-10-CM | POA: Insufficient documentation

## 2014-09-11 DIAGNOSIS — M542 Cervicalgia: Secondary | ICD-10-CM | POA: Insufficient documentation

## 2014-09-13 ENCOUNTER — Other Ambulatory Visit (HOSPITAL_COMMUNITY): Payer: Self-pay | Admitting: Orthopaedic Surgery

## 2014-09-13 ENCOUNTER — Ambulatory Visit: Payer: 59 | Admitting: Physical Therapy

## 2014-09-13 DIAGNOSIS — M542 Cervicalgia: Secondary | ICD-10-CM

## 2014-09-13 DIAGNOSIS — M792 Neuralgia and neuritis, unspecified: Secondary | ICD-10-CM | POA: Diagnosis not present

## 2014-09-13 NOTE — Patient Instructions (Signed)
Patient  To tell  MD about chest pain. (patient not in distress) Be sure to tell MD about her  "Blurry eyes" with neck extension and her dizziness with neck movement.

## 2014-09-13 NOTE — Therapy (Addendum)
[place], [place], [address] Phone: [phone]   Fax:  [phone]  Physical Therapy Treatment  Patient Details  Name: Victoria Craig MRN: [id number] Date of Birth: Jan 23, 1956  Encounter Date: 09/13/2014      PT End of Session - 09/13/14 0827    Visit Number 5   Number of Visits 12   Date for PT Re-Evaluation 09/28/14   PT Start Time 0732   PT Stop Time 0823   PT Time Calculation (min) 51 min   Activity Tolerance Patient limited by pain;Patient tolerated treatment well      Past Medical History  Diagnosis Date  . Essential hypertension, malignant   . Pure hypercholesterolemia   . Acute maxillary sinusitis   . Obesity, unspecified   . Mitral valve prolapse   . GERD (gastroesophageal reflux disease)   . Aphthous ulcer   . Sleep disturbance   . Depression   . Arthritis     Past Surgical History  Procedure Laterality Date  . Tubal ligation    . Colonoscopy    . Cholecystectomy  2011  . Cesarean section  [id number]  . Abdominal hysterectomy  1986  . Distal biceps tendon repair Right 04/15/2013    Procedure: DISTAL BICEPS TENDON REPAIR;  Surgeon: Marybelle Killings, MD;  Location: [place];  Service: Orthopedics;  Laterality: Right;  Right Distal Biceps Tendon Repair  . Trigger finger release Right 05/08/2014    Procedure: Excision Right Palm Mass, Trigger Finger Release Right Ring Finger;  Surgeon: Marybelle Killings, MD;  Location: [place];  Service: Orthopedics;  Laterality: Right;    There were no vitals taken for this visit.  Visit Diagnosis:  Cervicalgia      Subjective Assessment - 09/13/14 0751    Pain Radiating Towards --  arms, front head HA.  Dizzy and Blurry eyes reported.                      [facility] Adult PT Treatment/Exercise - 09/13/14 0758    Neck Exercises: Stretches   Upper Trapezius Stretch Limitations   Upper Trapezius Stretch Limitations 60 Rotation Rt,  Lt 75, Lateral Rt 30, Lt 32  60 Rotation Rt, Lt 75. Lateral Rt 30, Lt 32   Levator Stretch Limitations --  35 degrees   Other Neck Stretches --  extension 32 degrees , slow and guarded.  Blurry eyed report   Moist Heat Therapy   Number Minutes Moist Heat --  15   Moist Heat Location --  neck   Manual Therapy   Manual Therapy --  Taping as on 08/31/2014 except did not add anterior chest ta                  PT Short Term Goals - 09/13/14 0747    PT SHORT TERM GOAL #1   Title Pt report pain decr 25% or mroe    Time 3   Period Weeks   Status Achieved   PT SHORT TERM GOAL #2   Title Pt will verbalize understanding of good posture   PT SHORT TERM GOAL #3   Title Pt will report 25% less need for keeping arm held to body   Time 3   Period Weeks   Status Achieved           PT Long Term Goals - 09/13/14 0747    PT LONG TERM GOAL #1   Title independent with  HEP issued up to last visit   Time 6   Period Weeks   Status On-going   PT LONG TERM GOAL #2   Title PT will report pain decrease 75% ro more   Time 6   Status On-going   PT LONG TERM GOAL #3   Title PT will not hold her LT arm to body for comfort   Time 6   Period Weeks   Status On-going   PT LONG TERM GOAL #4   Time 6   Period Weeks   Status On-going               Plan - 09/13/14 0829    Clinical Impression Statement Md note  today, Neck ROM improving, Pain improving (40%), Need to keep arm close to body improved 50%.   PT Next Visit Plan See what MD says. Begin upper back exercises, rows, scapular stabilization.   Consulted and Agree with Plan of Care Patient        Problem List Patient Active Problem List   Diagnosis Date Noted  . Trigger finger, right 05/08/2014  . Rupture of left biceps tendon 04/15/2013   Melvenia Needles, PTA 09/13/2014 8:34 AM Phone: [phone] Fax: [phone]   [facility] 09/13/2014, 8:34 AM  [facility] [address] [place], [place], [address] Phone: [phone]   Fax:  [phone]

## 2014-09-18 ENCOUNTER — Other Ambulatory Visit (HOSPITAL_COMMUNITY): Payer: Self-pay | Admitting: Cardiovascular Disease

## 2014-09-18 ENCOUNTER — Ambulatory Visit: Payer: 59 | Admitting: Physical Therapy

## 2014-09-18 DIAGNOSIS — M542 Cervicalgia: Secondary | ICD-10-CM | POA: Diagnosis not present

## 2014-09-18 DIAGNOSIS — M792 Neuralgia and neuritis, unspecified: Secondary | ICD-10-CM

## 2014-09-18 NOTE — Patient Instructions (Signed)
Scapular row attached, elbows bent, straight. From standard exercises issued.

## 2014-09-18 NOTE — Therapy (Signed)
[place], [place], [address] Phone: [phone]   Fax:  [phone]  Physical Therapy Treatment  Patient Details  Name: Victoria Craig MRN: [id number] Date of Birth: [birth year] Referring Provider:  Marybelle Killings, MD  Encounter Date: 09/18/2014      PT End of Session - 09/18/14 0759    Visit Number 6   Number of Visits 12   Date for PT Re-Evaluation 09/28/14   PT Start Time 0731   PT Stop Time 0800   PT Time Calculation (min) 29 min   Activity Tolerance Patient limited by pain      Past Medical History  Diagnosis Date  . Essential hypertension, malignant   . Pure hypercholesterolemia   . Acute maxillary sinusitis   . Obesity, unspecified   . Mitral valve prolapse   . GERD (gastroesophageal reflux disease)   . Aphthous ulcer   . Sleep disturbance   . Depression   . Arthritis     Past Surgical History  Procedure Laterality Date  . Tubal ligation    . Colonoscopy    . Cholecystectomy  2011  . Cesarean section  [id number]  . Abdominal hysterectomy  1986  . Distal biceps tendon repair Right 04/15/2013    Procedure: DISTAL BICEPS TENDON REPAIR;  Surgeon: Marybelle Killings, MD;  Location: [place];  Service: Orthopedics;  Laterality: Right;  Right Distal Biceps Tendon Repair  . Trigger finger release Right 05/08/2014    Procedure: Excision Right Palm Mass, Trigger Finger Release Right Ring Finger;  Surgeon: Marybelle Killings, MD;  Location: [place];  Service: Orthopedics;  Laterality: Right;    There were no vitals taken for this visit.  Visit Diagnosis:  Cervicalgia  Radicular pain in left arm      Subjective Assessment - 09/18/14 0755    Symptoms Saw Md he ordered MRI.  Pulled on my head and the pressure released.  5/10 today.  "I'm feeling good"   Pain Score 5    Pain Location Neck   Pain Orientation Right;Left   Pain Type Acute pain   Pain Radiating Towards arms   Pain Onset 1  to 4 weeks ago   Aggravating Factors  moving around as day goes on   Pain Relieving Factors Md lifting head.   Effect of Pain on Daily Activities limits   Multiple Pain Sites No                    [facility] Adult PT Treatment/Exercise - 09/18/14 0756    Neck Exercises: Seated   Other Seated Exercise scapular row, bent, straight arms. yellow, added to home exercise.   Traction   Type of Traction Cervical   Min (lbs) 5   Max (lbs) 12   Hold Time 60   Rest Time 15   Time 15                PT Education - 09/18/14 0754    Education provided Yes   Education Details scapular row attached   Person(s) Educated Patient   Methods Explanation;Demonstration;Tactile cues;Handout   Comprehension Verbalized understanding;Returned demonstration          PT Short Term Goals - 09/13/14 0747    PT SHORT TERM GOAL #1   Title Pt report pain decr 25% or mroe    Time 3   Period Weeks   Status Achieved   PT SHORT TERM GOAL #2  Title Pt will verbalize understanding of good posture   PT SHORT TERM GOAL #3   Title Pt will report 25% less need for keeping arm held to body   Time 3   Period Weeks   Status Achieved           PT Long Term Goals - 09/13/14 0747    PT LONG TERM GOAL #1   Title independent with HEP issued up to last visit   Time 6   Period Weeks   Status On-going   PT LONG TERM GOAL #2   Title PT will report pain decrease 75% ro more   Time 6   Status On-going   PT LONG TERM GOAL #3   Title PT will not hold her LT arm to body for comfort   Time 6   Period Weeks   Status On-going   PT LONG TERM GOAL #4   Time 6   Period Weeks   Status On-going               Plan - 09/18/14 0759    Clinical Impression Statement pain improving, trial of traction. No new goals met or assessed.   PT Next Visit Plan assess Traction, MRI next week, review band exercises.   Consulted and Agree with Plan of Care Patient        Problem List Patient Active  Problem List   Diagnosis Date Noted  . Trigger finger, right 05/08/2014  . Rupture of left biceps tendon 04/15/2013  Melvenia Needles, PTA 09/18/2014 8:02 AM Phone: [phone] Fax: [phone]   [facility] 09/18/2014, 8:02 AM  [facility] [address] [place], [place], [address] Phone: [phone]   Fax:  [phone]

## 2014-09-18 NOTE — Telephone Encounter (Signed)
Rx(s) sent to pharmacy electronically.  

## 2014-09-20 ENCOUNTER — Ambulatory Visit: Payer: 59 | Admitting: Physical Therapy

## 2014-09-20 DIAGNOSIS — M542 Cervicalgia: Secondary | ICD-10-CM | POA: Diagnosis not present

## 2014-09-20 DIAGNOSIS — M792 Neuralgia and neuritis, unspecified: Secondary | ICD-10-CM

## 2014-09-20 NOTE — Therapy (Addendum)
[place], [place], [address] Phone: [phone]   Fax:  [phone]  Physical Therapy Treatment  Patient Details  Name: Victoria Craig MRN: [id number] Date of Birth: 1956/05/02 Referring Provider:  Marybelle Killings, MD  Encounter Date: 09/20/2014      PT End of Session - 09/20/14 0757    Visit Number 7   Number of Visits 12   Date for PT Re-Evaluation 09/28/14   PT Start Time 0733   PT Stop Time 0809   PT Time Calculation (min) 36 min   Activity Tolerance Patient tolerated treatment well      Past Medical History  Diagnosis Date  . Essential hypertension, malignant   . Pure hypercholesterolemia   . Acute maxillary sinusitis   . Obesity, unspecified   . Mitral valve prolapse   . GERD (gastroesophageal reflux disease)   . Aphthous ulcer   . Sleep disturbance   . Depression   . Arthritis     Past Surgical History  Procedure Laterality Date  . Tubal ligation    . Colonoscopy    . Cholecystectomy  2011  . Cesarean section  [id number]  . Abdominal hysterectomy  1986  . Distal biceps tendon repair Right 04/15/2013    Procedure: DISTAL BICEPS TENDON REPAIR;  Surgeon: Marybelle Killings, MD;  Location: [place];  Service: Orthopedics;  Laterality: Right;  Right Distal Biceps Tendon Repair  . Trigger finger release Right 05/08/2014    Procedure: Excision Right Palm Mass, Trigger Finger Release Right Ring Finger;  Surgeon: Marybelle Killings, MD;  Location: [facility];  Service: Orthopedics;  Laterality: Right;    There were no vitals taken for this visit.  Visit Diagnosis:  Cervicalgia  Radicular pain in left arm      Subjective Assessment - 09/20/14 0740    Symptoms MRI  Monday, pulleys increased pain last visit, traction felt good 2/10 now, Pain improved 50%.  No longer holds arm next to body.   Currently in Pain? Yes   Pain Score 2    Pain Location Neck   Pain Orientation Right;Left   Pain  Descriptors / Indicators Aching   Aggravating Factors  pulley exercise   Pain Relieving Factors traction   Multiple Pain Sites No     Has not had any dizzynes last few days.               [facility] Adult PT Treatment/Exercise - 09/20/14 0755    Traction   Type of Traction Cervical   Min (lbs) 5   Max (lbs) 13   Hold Time 60   Rest Time 15   Time 15                  PT Short Term Goals - 09/13/14 0747    PT SHORT TERM GOAL #1   Title Pt report pain decr 25% or mroe    Time 3   Period Weeks   Status Achieved   PT SHORT TERM GOAL #2   Title Pt will verbalize understanding of good posture   PT SHORT TERM GOAL #3   Title Pt will report 25% less need for keeping arm held to body   Time 3   Period Weeks   Status Achieved           PT Long Term Goals - 09/20/14 0759    PT LONG TERM GOAL #1   Title independent with HEP issued  up to last visit   Time 6   Period Weeks   Status On-going   PT LONG TERM GOAL #2   Title PT will report pain decrease 75% ro more   Time 6   Period Weeks   Status On-going   PT LONG TERM GOAL #3   Title PT will not hold her LT arm to body for comfort   Time 6   Period Weeks   Status Achieved   PT LONG TERM GOAL #4   Title Cervical rotation equal RT to LT   Time 6   Period Weeks   Status Unable to assess   PT LONG TERM GOAL #5   Title She will report improvement of symptoms of dizzness by 75%   Time 6   Status Achieved               Plan - 09/20/14 0757    Clinical Impression Statement LTG # 3 met,  Able to build home exercise   PT Next Visit Plan traction, review band exercises.  do any measuring needed   Consulted and Agree with Plan of Care Patient        Problem List Patient Active Problem List   Diagnosis Date Noted  . Trigger finger, right 05/08/2014  . Rupture of left biceps tendon 04/15/2013   Melvenia Needles, PTA 09/20/2014 8:02 AM Phone: [phone] Fax:  [phone]  [facility] 09/20/2014, 8:02 AM  [facility] [address] [place], [place], [address] Phone: [phone]   Fax:  [phone]     PHYSICAL THERAPY DISCHARGE SUMMARY  Visits from Start of Care: 5  Current functional level related to goals / functional outcomes: See Above. She did not return after this visit   Remaining deficits: Unknown   Education / Equipment: HEP  Plan:                                                    Patient goals were partially met. Patient is being discharged due to not returning since the last visit.  ?????

## 2014-09-25 ENCOUNTER — Ambulatory Visit (HOSPITAL_COMMUNITY)
Admission: RE | Admit: 2014-09-25 | Discharge: 2014-09-25 | Disposition: A | Discharge status: Home / Self Care | Payer: 59 | Source: Ambulatory Visit | Attending: Orthopaedic Surgery | Admitting: Orthopaedic Surgery

## 2014-09-25 DIAGNOSIS — M5021 Other cervical disc displacement,  high cervical region: Secondary | ICD-10-CM | POA: Diagnosis not present

## 2014-09-25 DIAGNOSIS — M542 Cervicalgia: Secondary | ICD-10-CM | POA: Diagnosis not present

## 2014-10-30 ENCOUNTER — Other Ambulatory Visit (HOSPITAL_COMMUNITY): Payer: Self-pay | Admitting: Cardiovascular Disease

## 2014-11-21 ENCOUNTER — Emergency Department (HOSPITAL_COMMUNITY)
Admission: EM | Admit: 2014-11-21 | Discharge: 2014-11-21 | Disposition: A | Discharge status: Home / Self Care | Payer: 59 | Attending: Emergency Medicine | Admitting: Emergency Medicine

## 2014-11-21 ENCOUNTER — Encounter (HOSPITAL_COMMUNITY): Payer: Self-pay

## 2014-11-21 DIAGNOSIS — Z8669 Personal history of other diseases of the nervous system and sense organs: Secondary | ICD-10-CM | POA: Diagnosis not present

## 2014-11-21 DIAGNOSIS — Z79899 Other long term (current) drug therapy: Secondary | ICD-10-CM | POA: Diagnosis not present

## 2014-11-21 DIAGNOSIS — L0291 Cutaneous abscess, unspecified: Secondary | ICD-10-CM

## 2014-11-21 DIAGNOSIS — E669 Obesity, unspecified: Secondary | ICD-10-CM | POA: Insufficient documentation

## 2014-11-21 DIAGNOSIS — L02811 Cutaneous abscess of head [any part, except face]: Secondary | ICD-10-CM | POA: Diagnosis present

## 2014-11-21 DIAGNOSIS — Z793 Long term (current) use of hormonal contraceptives: Secondary | ICD-10-CM | POA: Insufficient documentation

## 2014-11-21 DIAGNOSIS — I10 Essential (primary) hypertension: Secondary | ICD-10-CM | POA: Diagnosis not present

## 2014-11-21 DIAGNOSIS — I341 Nonrheumatic mitral (valve) prolapse: Secondary | ICD-10-CM | POA: Insufficient documentation

## 2014-11-21 DIAGNOSIS — M1990 Unspecified osteoarthritis, unspecified site: Secondary | ICD-10-CM | POA: Insufficient documentation

## 2014-11-21 DIAGNOSIS — F329 Major depressive disorder, single episode, unspecified: Secondary | ICD-10-CM | POA: Insufficient documentation

## 2014-11-21 DIAGNOSIS — K219 Gastro-esophageal reflux disease without esophagitis: Secondary | ICD-10-CM | POA: Diagnosis not present

## 2014-11-21 MED ORDER — CEPHALEXIN 250 MG PO CAPS: 250.0000 mg | ORAL_CAPSULE | Freq: Four times a day (QID) | ORAL | Status: DC

## 2014-11-21 MED ORDER — IBUPROFEN 200 MG PO TABS
600.0000 mg | ORAL_TABLET | Freq: Once | ORAL | Status: AC
  Administered 2014-11-21: 600 mg via ORAL
  Filled 2014-11-21: qty 3

## 2014-11-21 MED ORDER — LIDOCAINE HCL (PF) 1 % IJ SOLN
2.0000 mL | Freq: Once | INTRAMUSCULAR | Status: DC
  Filled 2014-11-21: qty 5

## 2014-11-21 MED ORDER — CEPHALEXIN 250 MG PO CAPS
250.0000 mg | ORAL_CAPSULE | Freq: Once | ORAL | Status: AC
  Administered 2014-11-21: 250 mg via ORAL
  Filled 2014-11-21: qty 1

## 2014-11-21 NOTE — ED Notes (Signed)
Patient reports she has had bad headaches over the past few days.  Reports she has two bumps on her head, that were called abscesses by a healthcare provider.

## 2014-11-21 NOTE — Discharge Instructions (Signed)
Abscess Care After An abscess (also called a boil or furuncle) is an infected area that contains a collection of pus. Signs and symptoms of an abscess include pain, tenderness, redness, or hardness, or you may feel a moveable soft area under your skin. An abscess can occur anywhere in the body. The infection may spread to surrounding tissues causing cellulitis. A cut (incision) by the surgeon was made over your abscess and the pus was drained out. Gauze may have been packed into the space to provide a drain that will allow the cavity to heal from the inside outwards. The boil may be painful for 5 to 7 days. Most people with a boil do not have high fevers. Your abscess, if seen early, may not have localized, and may not have been lanced. If not, another appointment may be required for this if it does not get better on its own or with medications. HOME CARE INSTRUCTIONS   Only take over-the-counter or prescription medicines for pain, discomfort, or fever as directed by your caregiver.  When you bathe, soak and then remove gauze or iodoform packs at least daily or as directed by your caregiver. You may then wash the wound gently with mild soapy water. Repack with gauze or do as your caregiver directs. SEEK IMMEDIATE MEDICAL CARE IF:   You develop increased pain, swelling, redness, drainage, or bleeding in the wound site.  You develop signs of generalized infection including muscle aches, chills, fever, or a general ill feeling.  An oral temperature above 102 F (38.9 C) develops, not controlled by medication. See your caregiver for a recheck if you develop any of the symptoms described above. If medications (antibiotics) were prescribed, take them as directed. Document Released: 03/13/2005 Document Revised: 11/17/2011 Document Reviewed: 11/08/2007 [facility] Patient Information 2015 [place], [place]. This information is not intended to replace advice given to you by your health care provider. Make sure  you discuss any questions you have with your health care provider. As discussed apply a warm compress to the area when you get him for approximately 10 minutes

## 2014-11-21 NOTE — ED Provider Notes (Signed)
CSN: [id number]     Arrival date & time 11/21/14  2440 History   First MD Initiated Contact with Patient 11/21/14 [phone]     Chief Complaint  Patient presents with  . Abscess     (Consider location/radiation/quality/duration/timing/severity/associated sxs/prior Treatment) Patient is a 59 y.o. female presenting with abscess. The history is provided by the patient.  Abscess Location:  Head/neck Head/neck abscess location:  Scalp Abscess quality: fluctuance, painful and redness   Red streaking: no   Progression:  Worsening Pain details:    Quality:  Aching   Severity:  Mild   Timing:  Constant   Progression:  Worsening Chronicity:  New Context: not diabetes and not immunosuppression   Relieved by:  Nothing Worsened by:  Nothing tried   Past Medical History  Diagnosis Date  . Essential hypertension, malignant   . Pure hypercholesterolemia   . Acute maxillary sinusitis   . Obesity, unspecified   . Mitral valve prolapse   . GERD (gastroesophageal reflux disease)   . Aphthous ulcer   . Sleep disturbance   . Depression   . Arthritis    Past Surgical History  Procedure Laterality Date  . Tubal ligation    . Colonoscopy    . Cholecystectomy  2011  . Cesarean section  [id number]  . Abdominal hysterectomy  1986  . Distal biceps tendon repair Right 04/15/2013    Procedure: DISTAL BICEPS TENDON REPAIR;  Surgeon: Marybelle Killings, MD;  Location: [place];  Service: Orthopedics;  Laterality: Right;  Right Distal Biceps Tendon Repair  . Trigger finger release Right 05/08/2014    Procedure: Excision Right Palm Mass, Trigger Finger Release Right Ring Finger;  Surgeon: Marybelle Killings, MD;  Location: [place];  Service: Orthopedics;  Laterality: Right;   Family History  Problem Relation Age of Onset  . Hypertension Other   . Heart disease Other    History  Substance Use Topics  . Smoking status: Never Smoker   . Smokeless tobacco: Never Used  . Alcohol Use: Yes     Comment:  Occ glass of wine   OB History    No data available     Review of Systems    Allergies  Review of patient's allergies indicates no known allergies.  Home Medications   Prior to Admission medications   Medication Sig Start Date End Date Taking? Authorizing Provider  cephALEXin (KEFLEX) 250 MG capsule Take 1 capsule (250 mg total) by mouth 4 (four) times daily. 11/21/14   Junius Creamer, NP  cyclobenzaprine (FLEXERIL) 10 MG tablet Take 0.5 tablets (5 mg total) by mouth 3 (three) times daily as needed for muscle spasms. Patient not taking: Reported on 08/17/2014 07/27/14   Pamella Pert, MD  diltiazem (CARTIA XT) 120 MG 24 hr capsule Take 120 mg by mouth daily.    Historical Provider, MD  estradiol (VIVELLE-DOT) 0.1 MG/24HR Place 1 patch onto the skin 2 (two) times a week. New patch on Thursday and Monday    Historical Provider, MD  HYDROcodone-acetaminophen (NORCO) 5-325 MG per tablet Take 1 tablet by mouth every 6 (six) hours as needed for moderate pain. 07/27/14   Pamella Pert, MD  influenza vac recombinant HA trivalent (FLUBLOK) injection Inject 0.5 mLs into the muscle once.    Historical Provider, MD  metoprolol succinate (TOPROL-XL) 25 MG 24 hr tablet Take 25 mg by mouth daily.    Historical Provider, MD  omeprazole (PRILOSEC) 20 MG capsule TAKE 1 CAPSULE BY MOUTH TWICE  A DAY BEFORE A MEAL (PLEASE MAKE APPOINTMENT) 07/31/14   Lorretta Harp, MD  sertraline (ZOLOFT) 100 MG tablet Take 100 mg by mouth daily.    Historical Provider, MD  triamterene-hydrochlorothiazide (DYAZIDE) 37.5-25 MG per capsule Take 1 capsule by mouth daily.     Historical Provider, MD  zolpidem (AMBIEN) 5 MG tablet Take 5 mg by mouth at bedtime as needed. For sleep    Historical Provider, MD   BP 142/92 mmHg  Pulse 88  Resp 18  Ht 5' (1.524 m)  Wt 185 lb (83.915 kg)  BMI 36.13 kg/m2  SpO2 100% Physical Exam  HENT:  Head:      ED Course  INCISION AND DRAINAGE Date/Time: 11/21/2014 5:44  AM Performed by: Junius Creamer Authorized by: Junius Creamer Consent: Verbal consent obtained. Written consent not obtained. Risks and benefits: risks, benefits and alternatives were discussed Consent given by: patient Patient understanding: patient states understanding of the procedure being performed Patient identity confirmed: verbally with patient Time out: Immediately prior to procedure a "time out" was called to verify the correct patient, procedure, equipment, support staff and site/side marked as required. Type: abscess Body area: head/neck Location details: scalp Anesthesia: local infiltration Local anesthetic: lidocaine 1% without epinephrine Anesthetic total: 1 ml Patient sedated: no Needle gauge: 18 Incision type: single straight Complexity: simple Drainage: purulent Drainage amount: scant Packing material: Vaseline gauze Patient tolerance: Patient tolerated the procedure well with no immediate complications   (including critical care time) Labs Review Labs Reviewed - No data to display  Imaging Review No results found.   EKG Interpretation None     patient has a second small abscess that I did not I and D at this time.  She will apply warm compresses and start the Keflex.  Follow-up with her primary care physician  MDM   Final diagnoses:  Abscess         Junius Creamer, NP 11/21/14 0545  Junius Creamer, NP 11/21/14 [place], MD 11/21/14 [phone]

## 2014-11-23 ENCOUNTER — Other Ambulatory Visit: Payer: Self-pay | Admitting: Internal Medicine

## 2014-11-23 ENCOUNTER — Ambulatory Visit
Admission: RE | Admit: 2014-11-23 | Discharge: 2014-11-23 | Disposition: A | Discharge status: Home / Self Care | Payer: 59 | Source: Ambulatory Visit | Attending: Internal Medicine | Admitting: Internal Medicine

## 2014-11-23 DIAGNOSIS — R109 Unspecified abdominal pain: Secondary | ICD-10-CM

## 2015-02-01 ENCOUNTER — Other Ambulatory Visit (HOSPITAL_COMMUNITY): Payer: Self-pay | Admitting: Orthopaedic Surgery

## 2015-02-01 DIAGNOSIS — M25561 Pain in right knee: Secondary | ICD-10-CM

## 2015-02-14 ENCOUNTER — Ambulatory Visit (HOSPITAL_COMMUNITY)
Admission: RE | Admit: 2015-02-14 | Discharge: 2015-02-14 | Disposition: A | Discharge status: Home / Self Care | Payer: 59 | Source: Ambulatory Visit | Attending: Orthopaedic Surgery | Admitting: Orthopaedic Surgery

## 2015-02-14 DIAGNOSIS — M25561 Pain in right knee: Secondary | ICD-10-CM | POA: Insufficient documentation

## 2015-02-14 DIAGNOSIS — R937 Abnormal findings on diagnostic imaging of other parts of musculoskeletal system: Secondary | ICD-10-CM | POA: Insufficient documentation

## 2015-03-19 ENCOUNTER — Other Ambulatory Visit (HOSPITAL_COMMUNITY): Payer: Self-pay | Admitting: Orthopaedic Surgery

## 2015-03-29 ENCOUNTER — Encounter: Payer: Self-pay | Admitting: *Deleted

## 2015-03-30 ENCOUNTER — Encounter (HOSPITAL_COMMUNITY): Payer: Self-pay | Admitting: Emergency Medicine

## 2015-03-30 ENCOUNTER — Emergency Department (INDEPENDENT_AMBULATORY_CARE_PROVIDER_SITE_OTHER)
Admission: EM | Admit: 2015-03-30 | Discharge: 2015-03-30 | Disposition: A | Discharge status: Home / Self Care | Payer: 59 | Source: Home / Self Care | Attending: Family Medicine | Admitting: Family Medicine

## 2015-03-30 DIAGNOSIS — L729 Follicular cyst of the skin and subcutaneous tissue, unspecified: Secondary | ICD-10-CM | POA: Diagnosis not present

## 2015-03-30 DIAGNOSIS — L089 Local infection of the skin and subcutaneous tissue, unspecified: Secondary | ICD-10-CM

## 2015-03-30 MED ORDER — HYDROCODONE-ACETAMINOPHEN 5-325 MG PO TABS: 1.0000 | ORAL_TABLET | ORAL | Status: DC | PRN

## 2015-03-30 MED ORDER — SULFAMETHOXAZOLE-TRIMETHOPRIM 800-160 MG PO TABS: 1.0000 | ORAL_TABLET | Freq: Two times a day (BID) | ORAL | Status: DC

## 2015-03-30 NOTE — ED Provider Notes (Signed)
CSN: [id number]     Arrival date & time 03/30/15  1653 History   First MD Initiated Contact with Patient 03/30/15 1659     Chief Complaint  Patient presents with  . Abscess   (Consider location/radiation/quality/duration/timing/severity/associated sxs/prior Treatment) HPI Comments: 59 year old female complaining of a red tender swollen area to the right lateral chest wall just distal to the axilla. This started about 5 days ago it is been getting worse in regards to pain and size.   Past Medical History  Diagnosis Date  . Essential hypertension, malignant   . Pure hypercholesterolemia   . Acute maxillary sinusitis   . Obesity, unspecified   . Mitral valve prolapse   . GERD (gastroesophageal reflux disease)   . Aphthous ulcer   . Sleep disturbance   . Depression   . Arthritis    Past Surgical History  Procedure Laterality Date  . Tubal ligation    . Colonoscopy    . Cholecystectomy  2011  . Cesarean section  [id number]  . Abdominal hysterectomy  1986  . Distal biceps tendon repair Right 04/15/2013    Procedure: DISTAL BICEPS TENDON REPAIR;  Surgeon: Marybelle Killings, MD;  Location: [place];  Service: Orthopedics;  Laterality: Right;  Right Distal Biceps Tendon Repair  . Trigger finger release Right 05/08/2014    Procedure: Excision Right Palm Mass, Trigger Finger Release Right Ring Finger;  Surgeon: Marybelle Killings, MD;  Location: [place];  Service: Orthopedics;  Laterality: Right;  . H/o event monitor  2014    Palpitations   Family History  Problem Relation Age of Onset  . Heart disease Mother   . Heart disease Father   . Hypertension Brother   . Cancer Brother   . Stroke Maternal Grandmother   . Heart disease Maternal Grandfather   . Heart disease Paternal Grandmother    History  Substance Use Topics  . Smoking status: Never Smoker   . Smokeless tobacco: Never Used  . Alcohol Use: Yes     Comment: Occ glass of wine   OB History    No data available      Review of Systems  Constitutional:       Patient states she has been working in the heat lately and feeling tired and fatigued and having some dizziness.  Respiratory: Negative.   Skin:       As per history of present illness  Neurological: Negative.   All other systems reviewed and are negative.   Allergies  Review of patient's allergies indicates no known allergies.  Home Medications   Prior to Admission medications   Medication Sig Start Date End Date Taking? Authorizing Provider  diltiazem (CARTIA XT) 120 MG 24 hr capsule Take 120 mg by mouth daily.    Historical Provider, MD  estradiol (VIVELLE-DOT) 0.1 MG/24HR Place 1 patch onto the skin 2 (two) times a week. New patch on Thursday and Monday    Historical Provider, MD  HYDROcodone-acetaminophen (NORCO/VICODIN) 5-325 MG per tablet Take 1 tablet by mouth every 4 (four) hours as needed. 03/30/15   Janne Napoleon, NP  influenza vac recombinant HA trivalent (FLUBLOK) injection Inject 0.5 mLs into the muscle once.    Historical Provider, MD  metoprolol succinate (TOPROL-XL) 25 MG 24 hr tablet Take 25 mg by mouth daily.    Historical Provider, MD  omeprazole (PRILOSEC) 20 MG capsule TAKE 1 CAPSULE BY MOUTH TWICE A DAY BEFORE A MEAL (PLEASE MAKE APPOINTMENT) 07/31/14   Roderic Palau  Adora Fridge, MD  sertraline (ZOLOFT) 100 MG tablet Take 100 mg by mouth daily.    Historical Provider, MD  sulfamethoxazole-trimethoprim (BACTRIM DS,SEPTRA DS) 800-160 MG per tablet Take 1 tablet by mouth 2 (two) times daily. 03/30/15 04/06/15  Janne Napoleon, NP  triamterene-hydrochlorothiazide (DYAZIDE) 37.5-25 MG per capsule Take 1 capsule by mouth daily.     Historical Provider, MD  zolpidem (AMBIEN) 5 MG tablet Take 5 mg by mouth at bedtime as needed. For sleep    Historical Provider, MD   BP 124/85 mmHg  Pulse 88  Temp(Src) 98.1 F (36.7 C) (Oral)  Resp 16  SpO2 96% Physical Exam  Constitutional: She is oriented to person, place, and time. She appears  well-developed and well-nourished. No distress.  Eyes: Conjunctivae and EOM are normal.  Neck: Normal range of motion. Neck supple.  Pulmonary/Chest: Effort normal. No respiratory distress.  Musculoskeletal: Normal range of motion.  Neurological: She is alert and oriented to person, place, and time.  Skin: Skin is warm and dry.  There is an approximately 4 cm raised red tender area to the right lateral chest wall at the midaxillary line just inferior to the axilla.  Psychiatric: She has a normal mood and affect.  Nursing note and vitals reviewed.   ED Course  INCISION AND DRAINAGE Date/Time: 03/30/2015 5:53 PM Performed by: Marcha Dutton, Keyaria Lawson Authorized by: Waldemar Dickens Consent: Verbal consent obtained. Risks and benefits: risks, benefits and alternatives were discussed Consent given by: patient Patient understanding: patient states understanding of the procedure being performed Type: abscess Body area: trunk Location details: chest Anesthesia: local infiltration Local anesthetic: lidocaine 2% without epinephrine Anesthetic total: 5 ml Patient sedated: no Scalpel size: 11 Incision type: single with marsupialization Complexity: simple Drainage: purulent and  bloody Drainage amount: moderate Wound treatment: drain placed Packing material: 1/4 in iodoform gauze Comments: Cystic wall contents expressed. Suspect this may be infected cyst, i.e.sebaceous.   (including critical care time) Labs Review Labs Reviewed  CULTURE, ROUTINE-ABSCESS    Imaging Review No results found.   MDM   1. Infected cyst of skin    Keep clean and dry Septra ds norco 5 mg #15 RTO 2 d for wound chk and pcking removal. Home rest in cool environment, lots of liquids    Janne Napoleon, NP 03/30/15 1755

## 2015-03-30 NOTE — ED Notes (Signed)
Patient has an abscess on right torso.  Noticed area on Monday.  Patient has a history of the same.  Red, swollen, warm to touch and painful area to right torso/ribs

## 2015-04-02 ENCOUNTER — Emergency Department (INDEPENDENT_AMBULATORY_CARE_PROVIDER_SITE_OTHER)
Admission: EM | Admit: 2015-04-02 | Discharge: 2015-04-02 | Disposition: A | Discharge status: Home / Self Care | Payer: 59 | Source: Home / Self Care | Attending: Emergency Medicine | Admitting: Emergency Medicine

## 2015-04-02 ENCOUNTER — Encounter (HOSPITAL_COMMUNITY): Payer: Self-pay | Admitting: Emergency Medicine

## 2015-04-02 ENCOUNTER — Encounter (HOSPITAL_BASED_OUTPATIENT_CLINIC_OR_DEPARTMENT_OTHER): Payer: Self-pay | Admitting: *Deleted

## 2015-04-02 DIAGNOSIS — Z5189 Encounter for other specified aftercare: Secondary | ICD-10-CM

## 2015-04-02 DIAGNOSIS — Z4801 Encounter for change or removal of surgical wound dressing: Secondary | ICD-10-CM

## 2015-04-02 LAB — CULTURE, ROUTINE-ABSCESS: SPECIAL REQUESTS: NORMAL

## 2015-04-02 MED ORDER — HYDROCODONE-ACETAMINOPHEN 5-325 MG PO TABS: 1.0000 | ORAL_TABLET | ORAL | Status: DC | PRN

## 2015-04-02 NOTE — Discharge Instructions (Signed)
It looks like it is healing well. Wash it with soap and water twice a day. Make sure you take all of your antibiotics. The pain should gradually improve over the next 2-3 days. If you see increased drainage, redness, increased pain, please come back.

## 2015-04-02 NOTE — ED Provider Notes (Signed)
CSN: [id number]     Arrival date & time 04/02/15  1434 History   First MD Initiated Contact with Patient 04/02/15 1657     Chief Complaint  Patient presents with  . Wound Check   (Consider location/radiation/quality/duration/timing/severity/associated sxs/prior Treatment) HPI  She is a 59 year old woman here for wound check. She was seen here 3 days ago for a abscess in the right chest wall. This was drained and packing was placed. She was started on Bactrim. She states it has been doing well. It is still painful at times. No fevers or chills. She is tolerating the antibiotics well. She denies any leakage of the bandage.  Past Medical History  Diagnosis Date  . Essential hypertension, malignant   . Pure hypercholesterolemia   . Acute maxillary sinusitis   . Obesity, unspecified   . Mitral valve prolapse     normal ECHO 12-01-12, EF 60%  . GERD (gastroesophageal reflux disease)   . Aphthous ulcer   . Sleep disturbance   . Depression   . Arthritis   . Lateral meniscal tear     right knee   Past Surgical History  Procedure Laterality Date  . Tubal ligation    . Colonoscopy    . Cholecystectomy  2011  . Cesarean section  [id number]  . Abdominal hysterectomy  1986  . Distal biceps tendon repair Right 04/15/2013    Procedure: DISTAL BICEPS TENDON REPAIR;  Surgeon: Marybelle Killings, MD;  Location: [place];  Service: Orthopedics;  Laterality: Right;  Right Distal Biceps Tendon Repair  . Trigger finger release Right 05/08/2014    Procedure: Excision Right Palm Mass, Trigger Finger Release Right Ring Finger;  Surgeon: Marybelle Killings, MD;  Location: [place];  Service: Orthopedics;  Laterality: Right;  . H/o event monitor  2014    Palpitations   Family History  Problem Relation Age of Onset  . Heart disease Mother   . Heart disease Father   . Hypertension Brother   . Cancer Brother   . Stroke Maternal Grandmother   . Heart disease Maternal Grandfather   . Heart disease Paternal  Grandmother    History  Substance Use Topics  . Smoking status: Never Smoker   . Smokeless tobacco: Never Used  . Alcohol Use: Yes     Comment: Occ glass of wine   OB History    No data available     Review of Systems As in history of present illness Allergies  Review of patient's allergies indicates no known allergies.  Home Medications   Prior to Admission medications   Medication Sig Start Date End Date Taking? Authorizing Provider  diltiazem (CARTIA XT) 120 MG 24 hr capsule Take 120 mg by mouth daily.    Historical Provider, MD  estradiol (VIVELLE-DOT) 0.1 MG/24HR Place 1 patch onto the skin 2 (two) times a week. New patch on Thursday and Monday    Historical Provider, MD  HYDROcodone-acetaminophen (NORCO/VICODIN) 5-325 MG per tablet Take 1 tablet by mouth every 4 (four) hours as needed. 03/30/15   Janne Napoleon, NP  influenza vac recombinant HA trivalent (FLUBLOK) injection Inject 0.5 mLs into the muscle once.    Historical Provider, MD  metoprolol succinate (TOPROL-XL) 25 MG 24 hr tablet Take 25 mg by mouth daily.    Historical Provider, MD  omeprazole (PRILOSEC) 20 MG capsule TAKE 1 CAPSULE BY MOUTH TWICE A DAY BEFORE A MEAL (PLEASE MAKE APPOINTMENT) 07/31/14   Lorretta Harp, MD  sertraline (  ZOLOFT) 100 MG tablet Take 100 mg by mouth daily.    Historical Provider, MD  sulfamethoxazole-trimethoprim (BACTRIM DS,SEPTRA DS) 800-160 MG per tablet Take 1 tablet by mouth 2 (two) times daily. 03/30/15 04/06/15  Janne Napoleon, NP  triamterene-hydrochlorothiazide (DYAZIDE) 37.5-25 MG per capsule Take 1 capsule by mouth daily.     Historical Provider, MD  zolpidem (AMBIEN) 5 MG tablet Take 5 mg by mouth at bedtime as needed. For sleep    Historical Provider, MD   BP 141/96 mmHg  Pulse 74  Temp(Src) 97.4 F (36.3 C) (Oral)  Resp 16  SpO2 98% Physical Exam  Constitutional: She is oriented to person, place, and time. She appears well-developed and well-nourished. No distress.   Cardiovascular: Normal rate.   Pulmonary/Chest: Effort normal.  Neurological: She is alert and oriented to person, place, and time.  Skin:  Abscess site at the right chest wall in the anterior axillary line.  Packing removed. No additional pus expressed. There is about 5 mm of surrounding induration.    ED Course  Procedures (including critical care time) Labs Review Labs Reviewed - No data to display  Imaging Review No results found.   MDM   1. Wound check, abscess    Wound culture reviewed. No need for re-packing. She finish the course of antibiotics. Wound care as in AVS. Follow up as needed.   Melony Overly, MD 04/02/15 1740

## 2015-04-02 NOTE — ED Notes (Signed)
Pt is here for wound recheck and packing removal pt was seen in clinic on 03/30/2015

## 2015-04-02 NOTE — ED Notes (Signed)
Final report of abscess positive for proteus, sensitive to agent Rx

## 2015-04-03 ENCOUNTER — Encounter (HOSPITAL_BASED_OUTPATIENT_CLINIC_OR_DEPARTMENT_OTHER)
Admission: RE | Admit: 2015-04-03 | Discharge: 2015-04-03 | Disposition: A | Discharge status: Home / Self Care | Payer: 59 | Source: Ambulatory Visit | Attending: Orthopaedic Surgery | Admitting: Orthopaedic Surgery

## 2015-04-03 DIAGNOSIS — K219 Gastro-esophageal reflux disease without esophagitis: Secondary | ICD-10-CM | POA: Diagnosis not present

## 2015-04-03 DIAGNOSIS — I10 Essential (primary) hypertension: Secondary | ICD-10-CM | POA: Diagnosis not present

## 2015-04-03 DIAGNOSIS — F329 Major depressive disorder, single episode, unspecified: Secondary | ICD-10-CM | POA: Diagnosis not present

## 2015-04-03 DIAGNOSIS — Z6841 Body Mass Index (BMI) 40.0 and over, adult: Secondary | ICD-10-CM | POA: Diagnosis not present

## 2015-04-03 DIAGNOSIS — S83281A Other tear of lateral meniscus, current injury, right knee, initial encounter: Secondary | ICD-10-CM | POA: Diagnosis not present

## 2015-04-03 DIAGNOSIS — M1990 Unspecified osteoarthritis, unspecified site: Secondary | ICD-10-CM | POA: Diagnosis not present

## 2015-04-03 DIAGNOSIS — E780 Pure hypercholesterolemia: Secondary | ICD-10-CM | POA: Diagnosis not present

## 2015-04-03 LAB — BASIC METABOLIC PANEL
Anion gap: 9 (ref 5–15)
BUN: 20 mg/dL (ref 6–20)
CO2: 27 mmol/L (ref 22–32)
Calcium: 9.3 mg/dL (ref 8.9–10.3)
Chloride: 100 mmol/L — ABNORMAL LOW (ref 101–111)
Creatinine, Ser: 0.86 mg/dL (ref 0.44–1.00)
GFR calc Af Amer: >60 mL/min (ref >60)
GFR calc non Af Amer: >60 mL/min (ref >60)
GLUCOSE: 92 mg/dL (ref 65–99)
Potassium: 3.3 mmol/L — ABNORMAL LOW (ref 3.5–5.1)
Sodium: 136 mmol/L (ref 135–145)

## 2015-04-03 LAB — CBC
HEMATOCRIT: 47.3 % — AB (ref 36.0–46.0)
Hemoglobin: 15.9 g/dL — ABNORMAL HIGH (ref 12.0–15.0)
MCH: 27.7 pg (ref 26.0–34.0)
MCHC: 33.6 g/dL (ref 30.0–36.0)
MCV: 82.5 fL (ref 78.0–100.0)
PLATELETS: 223 10*3/uL (ref 150–400)
RBC: 5.73 MIL/uL — ABNORMAL HIGH (ref 3.87–5.11)
RDW: 13.9 % (ref 11.5–15.5)
WBC: 6 10*3/uL (ref 4.0–10.5)
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
## 2015-04-06 ENCOUNTER — Encounter (HOSPITAL_BASED_OUTPATIENT_CLINIC_OR_DEPARTMENT_OTHER): Payer: Self-pay | Admitting: Anesthesiology

## 2015-04-06 ENCOUNTER — Ambulatory Visit (HOSPITAL_BASED_OUTPATIENT_CLINIC_OR_DEPARTMENT_OTHER): Payer: 59 | Admitting: Anesthesiology

## 2015-04-06 ENCOUNTER — Encounter (HOSPITAL_BASED_OUTPATIENT_CLINIC_OR_DEPARTMENT_OTHER)
Admission: RE | Disposition: A | Discharge status: Home / Self Care | Payer: Self-pay | Source: Ambulatory Visit | Attending: Orthopaedic Surgery

## 2015-04-06 ENCOUNTER — Ambulatory Visit (HOSPITAL_BASED_OUTPATIENT_CLINIC_OR_DEPARTMENT_OTHER)
Admission: RE | Admit: 2015-04-06 | Discharge: 2015-04-06 | Disposition: A | Discharge status: Home / Self Care | Payer: 59 | Source: Ambulatory Visit | Attending: Orthopaedic Surgery | Admitting: Orthopaedic Surgery

## 2015-04-06 DIAGNOSIS — S83206A Unspecified tear of unspecified meniscus, current injury, right knee, initial encounter: Secondary | ICD-10-CM

## 2015-04-06 DIAGNOSIS — S83281A Other tear of lateral meniscus, current injury, right knee, initial encounter: Secondary | ICD-10-CM | POA: Insufficient documentation

## 2015-04-06 DIAGNOSIS — Z6841 Body Mass Index (BMI) 40.0 and over, adult: Secondary | ICD-10-CM | POA: Insufficient documentation

## 2015-04-06 DIAGNOSIS — F329 Major depressive disorder, single episode, unspecified: Secondary | ICD-10-CM | POA: Insufficient documentation

## 2015-04-06 DIAGNOSIS — K219 Gastro-esophageal reflux disease without esophagitis: Secondary | ICD-10-CM | POA: Insufficient documentation

## 2015-04-06 DIAGNOSIS — E780 Pure hypercholesterolemia: Secondary | ICD-10-CM | POA: Insufficient documentation

## 2015-04-06 DIAGNOSIS — M1990 Unspecified osteoarthritis, unspecified site: Secondary | ICD-10-CM | POA: Insufficient documentation

## 2015-04-06 DIAGNOSIS — I10 Essential (primary) hypertension: Secondary | ICD-10-CM | POA: Insufficient documentation

## 2015-04-06 HISTORY — DX: Other tear of lateral meniscus, current injury, unspecified knee, initial encounter: S83.289A

## 2015-04-06 HISTORY — PX: KNEE ARTHROSCOPY: SHX127

## 2015-04-06 HISTORY — PX: KNEE ARTHROSCOPY WITH LATERAL MENISECTOMY: SHX6193

## 2015-04-06 LAB — POCT HEMOGLOBIN-HEMACUE: HEMOGLOBIN: 16.4 g/dL — AB (ref 12.0–15.0)

## 2015-04-06 SURGERY — ARTHROSCOPY, KNEE
Anesthesia: General | Site: Knee | Laterality: Right

## 2015-04-06 MED ORDER — MIDAZOLAM HCL 2 MG/2ML IJ SOLN: 1.0000 mg | INTRAMUSCULAR | Status: DC | PRN

## 2015-04-06 MED ORDER — OXYCODONE-ACETAMINOPHEN 5-325 MG PO TABS: 2.0000 | ORAL_TABLET | ORAL | Status: DC | PRN

## 2015-04-06 MED ORDER — HYDROMORPHONE HCL 1 MG/ML IJ SOLN
INTRAMUSCULAR | Status: AC
  Filled 2015-04-06: qty 1

## 2015-04-06 MED ORDER — CHLORHEXIDINE GLUCONATE 4 % EX LIQD: 60.0000 mL | Freq: Once | CUTANEOUS | Status: DC

## 2015-04-06 MED ORDER — GLYCOPYRROLATE 0.2 MG/ML IJ SOLN: 0.2000 mg | Freq: Once | INTRAMUSCULAR | Status: DC | PRN

## 2015-04-06 MED ORDER — FENTANYL CITRATE (PF) 100 MCG/2ML IJ SOLN: 50.0000 ug | INTRAMUSCULAR | Status: DC | PRN

## 2015-04-06 MED ORDER — OXYCODONE-ACETAMINOPHEN 5-325 MG PO TABS
ORAL_TABLET | ORAL | Status: AC
  Filled 2015-04-06: qty 1

## 2015-04-06 MED ORDER — PROPOFOL 10 MG/ML IV BOLUS
INTRAVENOUS | Status: DC | PRN
  Administered 2015-04-06: 200 mg via INTRAVENOUS

## 2015-04-06 MED ORDER — BUPIVACAINE-EPINEPHRINE (PF) 0.5% -1:200000 IJ SOLN
INTRAMUSCULAR | Status: DC | PRN
  Administered 2015-04-06: 20 mL

## 2015-04-06 MED ORDER — ONDANSETRON HCL 4 MG/2ML IJ SOLN
INTRAMUSCULAR | Status: DC | PRN
  Administered 2015-04-06: 4 mg via INTRAVENOUS

## 2015-04-06 MED ORDER — MIDAZOLAM HCL 2 MG/2ML IJ SOLN
INTRAMUSCULAR | Status: AC
  Filled 2015-04-06: qty 2

## 2015-04-06 MED ORDER — LACTATED RINGERS IV SOLN
INTRAVENOUS | Status: DC
  Administered 2015-04-06 (×2): via INTRAVENOUS

## 2015-04-06 MED ORDER — PROPOFOL 500 MG/50ML IV EMUL
INTRAVENOUS | Status: AC
  Filled 2015-04-06: qty 50

## 2015-04-06 MED ORDER — BUPIVACAINE-EPINEPHRINE (PF) 0.5% -1:200000 IJ SOLN
INTRAMUSCULAR | Status: AC
  Filled 2015-04-06: qty 30

## 2015-04-06 MED ORDER — OXYCODONE-ACETAMINOPHEN 5-325 MG PO TABS
1.0000 | ORAL_TABLET | Freq: Once | ORAL | Status: AC
Start: 2015-04-06 — End: 2015-04-06
  Administered 2015-04-06: 1 via ORAL

## 2015-04-06 MED ORDER — FENTANYL CITRATE (PF) 100 MCG/2ML IJ SOLN
INTRAMUSCULAR | Status: AC
  Filled 2015-04-06: qty 6

## 2015-04-06 MED ORDER — SODIUM CHLORIDE 0.9 % IR SOLN
Status: DC | PRN
  Administered 2015-04-06: 3000 mL

## 2015-04-06 MED ORDER — SCOPOLAMINE 1 MG/3DAYS TD PT72: 1.0000 | MEDICATED_PATCH | Freq: Once | TRANSDERMAL | Status: DC | PRN

## 2015-04-06 MED ORDER — PROPOFOL 10 MG/ML IV BOLUS
INTRAVENOUS | Status: AC
  Filled 2015-04-06: qty 80

## 2015-04-06 MED ORDER — LIDOCAINE HCL (CARDIAC) 20 MG/ML IV SOLN
INTRAVENOUS | Status: DC | PRN
  Administered 2015-04-06: 50 mg via INTRAVENOUS

## 2015-04-06 MED ORDER — MIDAZOLAM HCL 5 MG/5ML IJ SOLN
INTRAMUSCULAR | Status: DC | PRN
  Administered 2015-04-06: 2 mg via INTRAVENOUS

## 2015-04-06 MED ORDER — FENTANYL CITRATE (PF) 100 MCG/2ML IJ SOLN
INTRAMUSCULAR | Status: DC | PRN
  Administered 2015-04-06: 50 ug via INTRAVENOUS

## 2015-04-06 MED ORDER — HYDROMORPHONE HCL 1 MG/ML IJ SOLN
0.2500 mg | INTRAMUSCULAR | Status: DC | PRN
  Administered 2015-04-06 (×3): 0.5 mg via INTRAVENOUS

## 2015-04-06 MED ORDER — DEXAMETHASONE SODIUM PHOSPHATE 10 MG/ML IJ SOLN
INTRAMUSCULAR | Status: DC | PRN
  Administered 2015-04-06: 10 mg via INTRAVENOUS

## 2015-04-06 SURGICAL SUPPLY — 40 items
BANDAGE ELASTIC 6 VELCRO ST LF (GAUZE/BANDAGES/DRESSINGS) ×6 IMPLANT
BENZOIN TINCTURE PRP APPL 2/3 (GAUZE/BANDAGES/DRESSINGS) ×3 IMPLANT
BLADE 4.2CUDA (BLADE) ×3 IMPLANT
BLADE CUDA 5.5 (BLADE) IMPLANT
BLADE GREAT WHITE 4.2 (BLADE) IMPLANT
BLADE GREAT WHITE 4.2MM (BLADE)
CLOSURE WOUND 1/4X4 (GAUZE/BANDAGES/DRESSINGS) ×1
DRAPE ARTHROSCOPY W/POUCH 114 (DRAPES) ×3 IMPLANT
DRSG TEGADERM 4X4.75 (GAUZE/BANDAGES/DRESSINGS) ×3 IMPLANT
DURAPREP 26ML APPLICATOR (WOUND CARE) ×3 IMPLANT
ELECT MENISCUS 165MM 90D (ELECTRODE) IMPLANT
ELECT REM PT RETURN 9FT ADLT (ELECTROSURGICAL)
ELECTRODE REM PT RTRN 9FT ADLT (ELECTROSURGICAL) IMPLANT
GAUZE SPONGE 4X4 12PLY STRL (GAUZE/BANDAGES/DRESSINGS) ×3 IMPLANT
GLOVE BIO SURGEON STRL SZ 6.5 (GLOVE) ×2 IMPLANT
GLOVE BIO SURGEON STRL SZ7.5 (GLOVE) ×3 IMPLANT
GLOVE BIO SURGEONS STRL SZ 6.5 (GLOVE) ×1
GLOVE BIOGEL PI IND STRL 7.0 (GLOVE) ×2 IMPLANT
GLOVE BIOGEL PI IND STRL 8 (GLOVE) ×1 IMPLANT
GLOVE BIOGEL PI INDICATOR 7.0 (GLOVE) ×4
GLOVE BIOGEL PI INDICATOR 8 (GLOVE) ×2
GOWN STRL REUS W/ TWL LRG LVL3 (GOWN DISPOSABLE) ×2 IMPLANT
GOWN STRL REUS W/TWL LRG LVL3 (GOWN DISPOSABLE) ×4
HOLDER KNEE FOAM BLUE (MISCELLANEOUS) ×3 IMPLANT
KNEE WRAP E Z 3 GEL PACK (MISCELLANEOUS) ×3 IMPLANT
MANIFOLD NEPTUNE II (INSTRUMENTS) ×3 IMPLANT
PACK ARTHROSCOPY DSU (CUSTOM PROCEDURE TRAY) ×3 IMPLANT
PACK BASIN DAY SURGERY FS (CUSTOM PROCEDURE TRAY) ×3 IMPLANT
PAD CAST 4YDX4 CTTN HI CHSV (CAST SUPPLIES) ×1 IMPLANT
PADDING CAST ABS 4INX4YD NS (CAST SUPPLIES)
PADDING CAST ABS 6INX4YD NS (CAST SUPPLIES)
PADDING CAST ABS COTTON 4X4 ST (CAST SUPPLIES) IMPLANT
PADDING CAST ABS COTTON 6X4 NS (CAST SUPPLIES) IMPLANT
PADDING CAST COTTON 4X4 STRL (CAST SUPPLIES) ×2
PENCIL BUTTON HOLSTER BLD 10FT (ELECTRODE) IMPLANT
SET ARTHROSCOPY TUBING (MISCELLANEOUS) ×2
SET ARTHROSCOPY TUBING LN (MISCELLANEOUS) ×1 IMPLANT
STRIP CLOSURE SKIN 1/4X4 (GAUZE/BANDAGES/DRESSINGS) ×2 IMPLANT
TOWEL OR 17X24 6PK STRL BLUE (TOWEL DISPOSABLE) ×3 IMPLANT
WATER STERILE IRR 1000ML POUR (IV SOLUTION) ×3 IMPLANT

## 2015-04-06 NOTE — Anesthesia Preprocedure Evaluation (Addendum)
Anesthesia Evaluation  Patient identified by MRN, date of birth, ID band Patient awake    Reviewed: Allergy & Precautions, H&P , NPO status , Patient's Chart, lab work & pertinent test results, reviewed documented beta blocker date and time   Airway Mallampati: II  TM Distance: >3 FB Neck ROM: Full    Dental no notable dental hx. (+) Teeth Intact, Dental Advisory Given   Pulmonary neg pulmonary ROS,  breath sounds clear to auscultation  Pulmonary exam normal       Cardiovascular hypertension, Pt. on medications and Pt. on home beta blockers Rhythm:Regular Rate:Normal     Neuro/Psych Depression negative neurological ROS     GI/Hepatic Neg liver ROS, GERD-  Medicated and Controlled,  Endo/Other  Morbid obesity  Renal/GU negative Renal ROS  negative genitourinary   Musculoskeletal  (+) Arthritis -, Osteoarthritis,    Abdominal   Peds  Hematology negative hematology ROS (+)   Anesthesia Other Findings   Reproductive/Obstetrics negative OB ROS                            Anesthesia Physical Anesthesia Plan  ASA: III  Anesthesia Plan: General   Post-op Pain Management:    Induction: Intravenous  Airway Management Planned: LMA  Additional Equipment:   Intra-op Plan:   Post-operative Plan: Extubation in OR  Informed Consent: I have reviewed the patients History and Physical, chart, labs and discussed the procedure including the risks, benefits and alternatives for the proposed anesthesia with the patient or authorized representative who has indicated his/her understanding and acceptance.   Dental advisory given  Plan Discussed with: CRNA  Anesthesia Plan Comments:        Anesthesia Quick Evaluation

## 2015-04-06 NOTE — Op Note (Signed)
Test test  Preop diagnosis: Right knee lateral meniscal tear with. Meniscal cyst  Postop diagnosis: Same  Procedure: Diagnostic and operative arthroscopy right knee, partial lateral minute meniscectomy and debridement of meniscal cyst  Surgeon: Rodell Perna M.D.  Anesthesia: Gen. +20 mL Marcaine knee bloc               Tourniquet: None  Procedure: After the induction of anesthesia proximal thigh legholder prepping with DuraPrep the usual split sheets drapes arthroscopic pouch impervious stockinette Caban timeout procedure, all equipment was ready. Arthroscopic portals were made adjacent patellar tendon and inflow was run through the scope at 70 cm pressure. Patellofemoral joint showed some mild chondromalacia patella which was trimmed with the shaver no loose bodies were noted. Some partial tearing of the anterior medial portion of the anterior cruciate ligament was noted with some small fibrous bundle. Posterior lateral portion of the anterior cruciate ligament was normal and the rest of the anterior medial bundle was normal. Medial compartment showed normal meniscus normal cartilage surface no loose bodies meniscus was carefully probed. Lateral meniscus showed where and a tear of the anterior portion of the lateral meniscus. This was a complex tear shaggy and using a combination of shaver and a small straight baskets it was trimmed and smoothed. Pressure was applied laterally at the joint line and some ganglion type fluid was encountered and debridement with the shaver in this region for debridement assist was performed. Straight blood baskets were used to trim it back closed the meniscocapsular junction starting about the popliteal hiatus and extending anteriorly. There was cartilage wear with some grade 3 changes on the lateral femoral condyle and lateral tibial plateau which were trimmed and smoothed as well. Knee was thoroughly irrigated lavaged. Remaining lateral meniscal rim was palpated and was  stable. No remaining paranasal meniscal cyst fluid was present. Knee was thoroughly suctioned dry. Tincture benzoin Steri-Strips Tegaderm Marcaine infiltration 4 x 4's ABDs and web roll and Ace wrap 6 inch 2 were applied for postoperative dressing. Outpatient surgery is appropriate for treatment of this condition all follow-up 1 week. Signed Rodell Perna M.D. thanks

## 2015-04-06 NOTE — Brief Op Note (Signed)
04/06/2015  12:04 PM  PATIENT:  Victoria Craig  59 y.o. female  PRE-OPERATIVE DIAGNOSIS:  Right Knee Lateral Meniscal Tear, Parameniscal Cyst  POST-OPERATIVE DIAGNOSIS:  right knee lateral meniscal tear parameniscal cyst  PROCEDURE:  Procedure(s): Right Knee Arthroscopy, Partial Lateral Menisectomy, Debridement Parameniscal Cyst (Right)  SURGEON:  Surgeon(s) and Role:    * Marybelle Killings, MD - Primary  PHYSICIAN ASSISTANT:   ASSISTANTS: none   ANESTHESIA:   local and general  EBL:  Total I/O In: 700 [I.V.:700] Out: -   BLOOD ADMINISTERED:none  DRAINS: none   LOCAL MEDICATIONS USED:  MARCAINE     SPECIMEN:  No Specimen  DISPOSITION OF SPECIMEN:  N/A  COUNTS:  YES  TOURNIQUET:    DICTATION: .Dragon Dictation  PLAN OF CARE: Discharge to home after PACU  PATIENT DISPOSITION:  PACU - hemodynamically stable.   Delay start of Pharmacological VTE agent (>24hrs) due to surgical blood loss or risk of bleeding: not applicable

## 2015-04-06 NOTE — Anesthesia Postprocedure Evaluation (Signed)
  Anesthesia Post-op Note  Patient: Victoria Craig  Procedure(s) Performed: Procedure(s): Right Knee Arthroscopy, Partial Lateral Menisectomy, Debridement Parameniscal Cyst (Right)  Patient Location: PACU  Anesthesia Type:General  Level of Consciousness: awake and alert   Airway and Oxygen Therapy: Patient Spontanous Breathing  Post-op Pain: Controlled  Post-op Assessment: Post-op Vital signs reviewed, Patient's Cardiovascular Status Stable and Respiratory Function Stable  Post-op Vital Signs: Reviewed  Filed Vitals:   04/06/15 1245  BP: 113/64  Pulse: 69  Temp:   Resp: 14    Complications: No apparent anesthesia complications

## 2015-04-06 NOTE — Anesthesia Procedure Notes (Signed)
Procedure Name: LMA Insertion Date/Time: 04/06/2015 11:28 AM Performed by: Toula Moos L Pre-anesthesia Checklist: Patient identified, Emergency Drugs available, Suction available, Patient being monitored and Timeout performed Patient Re-evaluated:Patient Re-evaluated prior to inductionOxygen Delivery Method: Circle System Utilized Preoxygenation: Pre-oxygenation with 100% oxygen Intubation Type: IV induction Ventilation: Mask ventilation without difficulty LMA: LMA inserted LMA Size: 4.0 Number of attempts: 1 Airway Equipment and Method: Bite block Placement Confirmation: positive ETCO2 Tube secured with: Tape Dental Injury: Teeth and Oropharynx as per pre-operative assessment

## 2015-04-06 NOTE — Transfer of Care (Signed)
Immediate Anesthesia Transfer of Care Note  Patient: Victoria Craig  Procedure(s) Performed: Procedure(s): Right Knee Arthroscopy, Partial Lateral Menisectomy, Debridement Parameniscal Cyst (Right)  Patient Location: PACU  Anesthesia Type:General  Level of Consciousness: awake and patient cooperative  Airway & Oxygen Therapy: Patient Spontanous Breathing and Patient connected to face mask oxygen  Post-op Assessment: Report given to RN and Post -op Vital signs reviewed and stable  Post vital signs: Reviewed and stable  Last Vitals:  Filed Vitals:   04/06/15 0945  BP: 122/71  Temp: 36.6 C  Resp: 18    Complications: No apparent anesthesia complications

## 2015-04-06 NOTE — Interval H&P Note (Signed)
History and Physical Interval Note:  04/06/2015 11:19 AM  Victoria Craig  has presented today for surgery, with the diagnosis of Right Knee Lateral Meniscal Tear, Parameniscal Cyst  The various methods of treatment have been discussed with the patient and family. After consideration of risks, benefits and other options for treatment, the patient has consented to  Procedure(s): Right Knee Arthroscopy, Partial Lateral Menisectomy, Debridement Parameniscal Cyst (Right) as a surgical intervention .  The patient's history has been reviewed, patient examined, no change in status, stable for surgery.  I have reviewed the patient's chart and labs.  Questions were answered to the patient's satisfaction.     Alyssamae Klinck C

## 2015-04-06 NOTE — Discharge Instructions (Addendum)
Knee, Cartilage (Meniscus) Injury It is suspected that you have a torn cartilage (meniscus) in your knee. The menisci are made of tough cartilage and fit between the surfaces of the thigh and leg bones. The menisci are C-shaped and have a wedged profile. The wedged profile helps the stability of the joint by keeping the rounded femur surface from sliding off the flat tibial surface. The menisci are fed (nourished) by small blood vessels, but there is also a large area at the inner edge of the meniscus that does not have a good blood supply (avascular). This presents a problem when there is an injury to the meniscus because areas without good blood supply heal poorly. As a result when there is a torn cartilage in the knee, surgery is often required to fix it. This is usually done with a surgical procedure less invasive than open surgery (arthroscopy). Some times open surgery of the knee is required if there is other damage. PURPOSE OF THE MENISCUS The medial meniscus rests on the medial tibial plateau. The tibia is the large bone in your lower leg (the shin bone). The medial tibial plateau is the upper end of the bone making up the inner part of your knee. The lateral meniscus serves the same purpose and is located on the outside of the knee. The menisci help to distribute your body weight across the knee joint; they act as shock absorbers. Without the meniscus present, the weight of your body would be unevenly applied to the bones in your legs (the femur and tibia). The femur is the large bone in your thigh. This uneven weight distribution would cause increased wear and tear on the cartilage lining the joint surfaces, leading to early damage (arthritis) of these areas. The presence of the menisci cartilage is necessary for a healthy knee. PURPOSE OF THE KNEE CARTILAGE The knee joint is made up of three bones: the thigh bone (femur), the shin bone (tibia), and the knee cap (patella). The surfaces of these bones  at the knee joint are covered with cartilage called articular cartilage. This smooth, slippery surface allows the bones to slide against each other without causing bone damage. The meniscus sits between these cartilaginous surfaces of the bones. It distributes the weight evenly in the joints and helps with the stability of the joint (keeps the joint steady). HOME CARE INSTRUCTIONS  Use crutches and external braces as instructed.  Once home, an ice pack applied to your injured knee may help with discomfort and keep the swelling down. An ice pack can be used for the first couple of days or as instructed.  Only take over-the-counter or prescription medicines for pain, discomfort, or fever as directed by your caregiver.  Call if you do not have relief of pain with medications or if there is increasing in pain.  Call if your foot becomes cold or blue.  You may resume normal diet and activities as directed.  Make sure to keep your appointments with your follow-up caregiver. This injury may require further evaluation and treatment beyond the temporary treatment given today. Document Released: 11/15/2002 Document Revised: 01/09/2014 Document Reviewed: 03/09/2009 [facility] Patient Information 2015 [place], [place]. This information is not intended to replace advice given to you by your health care provider. Make sure you discuss any questions you have with your health care provider.    Keep knee elevated above heart level.  Use crutches only if unable to walk to bathroom. Use ice on and off for 24 to 48  hrs then as needed. See Dr. Lorin Mercy in one week . May remove dressing in 2 days, leave op site on , may shower then rewrap with ace wrap for swelling.     Post Anesthesia Home Care Instructions  Activity: Get plenty of rest for the remainder of the day. A responsible adult should stay with you for 24 hours following the procedure.  For the next 24 hours, DO NOT: -Drive a car -Paediatric nurse -Drink  alcoholic beverages -Take any medication unless instructed by your physician -Make any legal decisions or sign important papers.  Meals: Start with liquid foods such as gelatin or soup. Progress to regular foods as tolerated. Avoid greasy, spicy, heavy foods. If nausea and/or vomiting occur, drink only clear liquids until the nausea and/or vomiting subsides. Call your physician if vomiting continues.  Special Instructions/Symptoms: Your throat may feel dry or sore from the anesthesia or the breathing tube placed in your throat during surgery. If this causes discomfort, gargle with warm salt water. The discomfort should disappear within 24 hours.  If you had a scopolamine patch placed behind your ear for the management of post- operative nausea and/or vomiting:  1. The medication in the patch is effective for 72 hours, after which it should be removed.  Wrap patch in a tissue and discard in the trash. Wash hands thoroughly with soap and water. 2. You may remove the patch earlier than 72 hours if you experience unpleasant side effects which may include dry mouth, dizziness or visual disturbances. 3. Avoid touching the patch. Wash your hands with soap and water after contact with the patch.

## 2015-04-09 ENCOUNTER — Encounter (HOSPITAL_BASED_OUTPATIENT_CLINIC_OR_DEPARTMENT_OTHER): Payer: Self-pay | Admitting: Orthopaedic Surgery

## 2015-04-18 ENCOUNTER — Encounter (HOSPITAL_BASED_OUTPATIENT_CLINIC_OR_DEPARTMENT_OTHER): Payer: Self-pay | Admitting: Orthopaedic Surgery

## 2015-05-01 ENCOUNTER — Encounter: Payer: Self-pay | Admitting: Cardiovascular Disease
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
## 2015-10-24 ENCOUNTER — Ambulatory Visit: Payer: No Typology Code available for payment source | Admitting: Occupational Therapy

## 2015-10-25 ENCOUNTER — Ambulatory Visit: Payer: BLUE CROSS/BLUE SHIELD | Attending: Orthopaedic Surgery | Admitting: *Deleted

## 2015-10-25 DIAGNOSIS — M6281 Muscle weakness (generalized): Secondary | ICD-10-CM | POA: Diagnosis present

## 2015-10-25 DIAGNOSIS — Z9889 Other specified postprocedural states: Secondary | ICD-10-CM | POA: Diagnosis present

## 2015-10-25 DIAGNOSIS — M25631 Stiffness of right wrist, not elsewhere classified: Secondary | ICD-10-CM | POA: Diagnosis present

## 2015-10-25 DIAGNOSIS — M79641 Pain in right hand: Secondary | ICD-10-CM | POA: Diagnosis present

## 2015-10-25 DIAGNOSIS — R29898 Other symptoms and signs involving the musculoskeletal system: Secondary | ICD-10-CM | POA: Insufficient documentation
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
## 2015-10-25 NOTE — Patient Instructions (Addendum)
Flexor Tendon Gliding (Active Hook Fist)   With fingers and knuckles straight, bend middle and tip joints. Do not bend large knuckles. Repeat _10-15___ times. Do _4-6___ sessions per day.  MP Flexion (Active)   With back of hand on table, bend large knuckles as far as they will go, keeping small joints straight. Repeat _10-15___ times. Do __4-6__ sessions per day. Activity: Reach into a narrow container.*      Finger Flexion / Extension   With palm up, bend fingers of left hand toward palm, making a  fist. Straighten fingers, opening fist. Repeat sequence _10-15___ times per session. Do _4-6__ sessions per day. Hand Variation: Palm down   Copyright  VHI. All rights reserved.   

## 2015-10-29 ENCOUNTER — Ambulatory Visit: Payer: BLUE CROSS/BLUE SHIELD | Admitting: Occupational Therapy

## 2015-10-29 ENCOUNTER — Encounter: Payer: Self-pay | Admitting: Occupational Therapy

## 2015-10-29 DIAGNOSIS — R29898 Other symptoms and signs involving the musculoskeletal system: Secondary | ICD-10-CM

## 2015-10-29 DIAGNOSIS — Z9889 Other specified postprocedural states: Secondary | ICD-10-CM | POA: Diagnosis not present

## 2015-10-29 DIAGNOSIS — M79641 Pain in right hand: Secondary | ICD-10-CM

## 2015-10-29 DIAGNOSIS — M25631 Stiffness of right wrist, not elsewhere classified: Secondary | ICD-10-CM

## 2015-10-29 NOTE — Therapy (Signed)
[facility] [address] [place], [place], [address] Phone: [phone]   Fax:  [phone]  Occupational Therapy Treatment  Patient Details  Name: Victoria Craig MRN: [id number] Date of Birth: 04-21-1956 Referring Provider: Marybelle Killings  Encounter Date: 10/29/2015      OT End of Session - 10/29/15 1317    Visit Number 2   Number of Visits 16   Date for OT Re-Evaluation 12/20/15   Authorization Type Blue cross blue shield (per patient, as she no longer works for [employer])   OT Start Time 1016   OT Stop Time 1058   OT Time Calculation (min) 42 min      Past Medical History  Diagnosis Date  . Essential hypertension, malignant   . Pure hypercholesterolemia   . Acute maxillary sinusitis   . Obesity, unspecified   . Mitral valve prolapse     normal ECHO 12-01-12, EF 60%  . GERD (gastroesophageal reflux disease)   . Aphthous ulcer   . Sleep disturbance   . Depression   . Arthritis   . Lateral meniscal tear     right knee    Past Surgical History  Procedure Laterality Date  . Tubal ligation    . Colonoscopy    . Cholecystectomy  2011  . Cesarean section  [id number]  . Abdominal hysterectomy  1986  . Distal biceps tendon repair Right 04/15/2013    Procedure: DISTAL BICEPS TENDON REPAIR;  Surgeon: Marybelle Killings, MD;  Location: [facility];  Service: Orthopedics;  Laterality: Right;  Right Distal Biceps Tendon Repair  . Trigger finger release Right 05/08/2014    Procedure: Excision Right Palm Mass, Trigger Finger Release Right Ring Finger;  Surgeon: Marybelle Killings, MD;  Location: [facility];  Service: Orthopedics;  Laterality: Right;  . H/o event monitor  2014    Palpitations  . Knee arthroscopy Right 04/06/2015    Procedure: Right Knee Arthroscopy, Partial Lateral Menisectomy, Debridement Parameniscal Cyst;  Surgeon: Marybelle Killings, MD;  Location: [facility];  Service: Orthopedics;  Laterality:  Right;  . Knee arthroscopy with lateral menisectomy Right 04/06/2015    Procedure: KNEE ARTHROSCOPY WITH PARTIAL LATERAL MENISECTOMY;  Surgeon: Marybelle Killings, MD;  Location: [facility];  Service: Orthopedics;  Laterality: Right;    There were no vitals filed for this visit.  Visit Diagnosis:  Decreased grip strength of right hand  Decreased pinch strength  Pain of right hand  Stiffness of wrist joint, right      Subjective Assessment - 10/29/15 1021    Subjective  I wear this ace wrap on my R hand all the time - it feels better   Pertinent History see epic   Patient Stated Goals using hand again, it's very limiting    Currently in Pain? Yes   Pain Score 8    Pain Location Wrist  volar surface   Pain Orientation Right   Pain Descriptors / Indicators Aching;Sharp   Pain Type Acute pain   Pain Onset More than a month ago   Pain Frequency Intermittent   Aggravating Factors  using hand, repetive movement   Pain Relieving Factors rest, OTC meds   Effect of Pain on Daily Activities ADL's, IADL's, leisure activities   Multiple Pain Sites No                      OT Treatments/Exercises (OP) - 10/29/15 0001  ADLs   ADL Comments reviewed all STG and LTG's. Demonstrate scar massage to pt for deceasing scar tissue and to begin to desensitize area around incision site. Pt has been wearing ace wrap on R hand since surgery.  Dicussed with pt need to d/c this ace wrap as it restricts ROM and also blocks sensory input into R hand making hand even more sensitive. Pt also strongly encouraged to start using L hand for light tasks such as self feeding, washing face, etc.  Pt is currently not using her R hand very much at all and tends to guard it and use non dominant hand.    Exercises   Exercises Wrist;Hand   Wrist Exercises   Other wrist exercises Reviewed wrist flexion/extension exercises pt - pt needed min cues to perform more slowly, truly go as far as she can,  and to hold each stretch for count of 10   Hand Exercises   Other Hand Exercises Reviewed all tendon glides with pt - pt needed min vc's for correct positioning and to hold stretches.  Instructed per protocol 15 reps x4 times per day and to use heat prior and ice after.    Modalities   Modalities Moist Heat  while reviewing goals with pt   Moist Heat Therapy   Number Minutes Moist Heat 5 Minutes   Moist Heat Location Wrist   Manual Therapy   Manual Therapy Joint mobilization;Soft tissue mobilization   Manual therapy comments To increase flexibility and range of motion prior to exercise. Pt with difficulty tolerating - needs max encouragement.                  OT Short Term Goals - 10/29/15 1315    OT SHORT TERM GOAL #1   Title Pt will be independent with initial HEP - check STG by 11/22/15   Time 4   Status On-going   OT SHORT TERM GOAL #2   Title Pt will increase right grip strength to 30 lbs - check STG by 11/22/15   Baseline 25lbs   Time 4   Period Weeks   Status On-going   OT SHORT TERM GOAL #3   Title Pt will increase right 3 point pinch strength to 7 lbs - check STG by 11/22/15   Baseline 2lbs   Time 4   Period Weeks   Status On-going   OT SHORT TERM GOAL #4   Title Pt will be independent with scar massage and desensitization technique - check STG by 11/22/15   Time 4   Period Weeks   Status On-going   OT SHORT TERM GOAL #5   Title Therapist and patient will look into splinting options as needed and as apporpriate - check STG by 11/22/15   Time 4   Period Weeks   Status On-going           OT Long Term Goals - 10/29/15 1315    OT LONG TERM GOAL #1   Title Pt will be independent with modified HEP   Time 8   Period Weeks   Status On-going   OT LONG TERM GOAL #2   Title Pt will have decreased pain during ADL and functional activities (less than 2/10 pain)   Time 8   Period Weeks   Status On-going   OT LONG TERM GOAL #3   Title Pt will increase right  gip strength to 38lbs   Baseline 25lbs   Time 8   Period Weeks  Status On-going   OT LONG TERM GOAL #4   Title Pt will improve 3 point pinch strength to 15lbs   Baseline 2lbs    Time 8   Period Weeks   Status On-going               Plan - 10/29/15 1315    Clinical Impression Statement Pt making slow progress toward goals.  Pt needs max encouragement to participate due to pain and lack of use/guarding of R hand for past two months.   Pt will benefit from skilled therapeutic intervention in order to improve on the following deficits (Retired) Decreased coordination;Decreased range of motion;Decreased strength;Decreased scar mobility;Impaired sensation;Pain;Impaired UE functional use   Rehab Potential Good   Clinical Impairments Affecting Rehab Potential pain   OT Frequency 2x / week   OT Duration 8 weeks   OT Treatment/Interventions Self-care/ADL training;Moist Heat;Traction;Ultrasound;Iontophoresis;Parrafin;Fluidtherapy;Contrast Bath;Therapeutic exercise;Scar mobilization;Passive range of motion;Splinting;Therapeutic exercises;Therapeutic activities;Patient/family education   Plan go over HEP again, address pain mgmt, ROM,    Consulted and Agree with Plan of Care Patient        Problem List Patient Active Problem List   Diagnosis Date Noted  . Acute meniscal tear of right knee 04/06/2015  . Trigger finger, right 05/08/2014  . Rupture of left biceps tendon 04/15/2013    Quay Burow, OTR/L 10/29/2015, 3:20 PM  [facility] [address] [place], [place], [address] Phone: [phone]   Fax:  [phone]  Name: Victoria Craig MRN: [id number] Date of Birth: [birth year]

## 2015-10-29 NOTE — Patient Instructions (Signed)
Reviewed tendon glides, wrist exercises, scar massage/desensitization

## 2015-10-30 ENCOUNTER — Encounter: Payer: Self-pay | Admitting: Occupational Therapy

## 2015-10-30 ENCOUNTER — Ambulatory Visit: Payer: BLUE CROSS/BLUE SHIELD | Admitting: Occupational Therapy

## 2015-10-30 DIAGNOSIS — Z9889 Other specified postprocedural states: Secondary | ICD-10-CM | POA: Diagnosis not present

## 2015-10-30 DIAGNOSIS — M25631 Stiffness of right wrist, not elsewhere classified: Secondary | ICD-10-CM

## 2015-10-30 DIAGNOSIS — M79641 Pain in right hand: Secondary | ICD-10-CM

## 2015-10-30 DIAGNOSIS — R29898 Other symptoms and signs involving the musculoskeletal system: Secondary | ICD-10-CM

## 2015-10-30 NOTE — Therapy (Signed)
[facility] [address] [place], [place], [address] Phone: [phone]   Fax:  [phone]  Occupational Therapy Treatment  Patient Details  Name: Victoria Craig MRN: [id number] Date of Birth: [birth year] Referring Provider: Marybelle Killings  Encounter Date: 10/30/2015      OT End of Session - 10/30/15 1305    Visit Number 3   Number of Visits 16   Date for OT Re-Evaluation 12/20/15   Authorization Type Blue cross blue shield (per patient, as she no longer works for [employer])   OT Start Time 1150   OT Stop Time 1233   OT Time Calculation (min) 43 min   Activity Tolerance Patient tolerated treatment well      Past Medical History  Diagnosis Date  . Essential hypertension, malignant   . Pure hypercholesterolemia   . Acute maxillary sinusitis   . Obesity, unspecified   . Mitral valve prolapse     normal ECHO 12-01-12, EF 60%  . GERD (gastroesophageal reflux disease)   . Aphthous ulcer   . Sleep disturbance   . Depression   . Arthritis   . Lateral meniscal tear     right knee    Past Surgical History  Procedure Laterality Date  . Tubal ligation    . Colonoscopy    . Cholecystectomy  2011  . Cesarean section  [id number]  . Abdominal hysterectomy  1986  . Distal biceps tendon repair Right 04/15/2013    Procedure: DISTAL BICEPS TENDON REPAIR;  Surgeon: Marybelle Killings, MD;  Location: [facility];  Service: Orthopedics;  Laterality: Right;  Right Distal Biceps Tendon Repair  . Trigger finger release Right 05/08/2014    Procedure: Excision Right Palm Mass, Trigger Finger Release Right Ring Finger;  Surgeon: Marybelle Killings, MD;  Location: [facility];  Service: Orthopedics;  Laterality: Right;  . H/o event monitor  2014    Palpitations  . Knee arthroscopy Right 04/06/2015    Procedure: Right Knee Arthroscopy, Partial Lateral Menisectomy, Debridement Parameniscal Cyst;  Surgeon: Marybelle Killings, MD;  Location: [facility];  Service: Orthopedics;  Laterality: Right;  . Knee arthroscopy with lateral menisectomy Right 04/06/2015    Procedure: KNEE ARTHROSCOPY WITH PARTIAL LATERAL MENISECTOMY;  Surgeon: Marybelle Killings, MD;  Location: [facility];  Service: Orthopedics;  Laterality: Right;    There were no vitals filed for this visit.  Visit Diagnosis:  Decreased grip strength of right hand  Decreased pinch strength  Pain of right hand  Stiffness of wrist joint, right      Subjective Assessment - 10/30/15 1158    Subjective  It hurts so bad. I wore this ace wrap last night   Pertinent History see epic, s/p CTR 08/06/15   Patient Stated Goals using hand again, it's very limiting    Currently in Pain? Yes   Pain Score 10-Worst pain ever   Pain Location Wrist   Pain Orientation Right   Pain Descriptors / Indicators Dull   Pain Type Acute pain   Pain Onset More than a month ago   Pain Frequency Intermittent   Aggravating Factors  using hand, repetitive movement   Pain Relieving Factors rest, OTC Meds                      OT Treatments/Exercises (OP) - 10/30/15 0001    ADLs   ADL Comments Desensitization techniques issued and reviewed. Therapist performed  scar massage to incision area then had pt return demo. Then had pt rub area for desensitization using towel after initial demonstration. Therapist also had pt run hand under water for desensitization, and instructed pt to fill tupperware container with rice and run hand/wrist through it for 5 minutes up to 2x/day. Pt instructed to use Rt hand as dominant hand for all BADLS and for lightweight objects (putting groceries away, picking up phone, remote, etc)    Hand Exercises   Other Hand Exercises Reviewed tendon gliding exercises. Then had pt perform wrist flex/ext x 15 reps each, and putty ex's with yellow putty for mass composite flexion and tip pinch each x 15 reps. Issued yellow putty                 OT Education - 10/30/15 1225    Education provided Yes   Education Details Putty HEP, Desensitization techniques (all previous ex's reviewed verbally)   Person(s) Educated Patient   Methods Explanation;Demonstration;Handout   Comprehension Verbalized understanding;Returned demonstration          OT Short Term Goals - 10/29/15 1315    OT SHORT TERM GOAL #1   Title Pt will be independent with initial HEP - check STG by 11/22/15   Time 4   Status On-going   OT SHORT TERM GOAL #2   Title Pt will increase right grip strength to 30 lbs - check STG by 11/22/15   Baseline 25lbs   Time 4   Period Weeks   Status On-going   OT SHORT TERM GOAL #3   Title Pt will increase right 3 point pinch strength to 7 lbs - check STG by 11/22/15   Baseline 2lbs   Time 4   Period Weeks   Status On-going   OT SHORT TERM GOAL #4   Title Pt will be independent with scar massage and desensitization technique - check STG by 11/22/15   Time 4   Period Weeks   Status On-going   OT SHORT TERM GOAL #5   Title Therapist and patient will look into splinting options as needed and as apporpriate - check STG by 11/22/15   Time 4   Period Weeks   Status On-going           OT Long Term Goals - 10/29/15 1315    OT LONG TERM GOAL #1   Title Pt will be independent with modified HEP   Time 8   Period Weeks   Status On-going   OT LONG TERM GOAL #2   Title Pt will have decreased pain during ADL and functional activities (less than 2/10 pain)   Time 8   Period Weeks   Status On-going   OT LONG TERM GOAL #3   Title Pt will increase right gip strength to 38lbs   Baseline 25lbs   Time 8   Period Weeks   Status On-going   OT LONG TERM GOAL #4   Title Pt will improve 3 point pinch strength to 15lbs   Baseline 2lbs    Time 8   Period Weeks   Status On-going               Plan - 10/30/15 1306    Clinical Impression Statement Pt making progress with desensitization by end of session. (Pt arrived  with new ace wrap, and therapist instructed pt that therapy would have no impact if she continued to wear, therefore agreed to throw ace wrap away).    Plan  review putty HEP, functional use of Rt hand, if fluido in service - place in [place] [facility] patient [place] [facility], including tendon gliding exercises on 10/24/2014. Issued putty HEP and desensitization techniques 10/30/15   Consulted and Agree with Plan of Care Patient        Problem List Patient Active Problem List   Diagnosis Date Noted  . Acute meniscal tear of right knee 04/06/2015  . Trigger finger, right 05/08/2014  . Rupture of left biceps tendon 04/15/2013    Carey Bullocks, OTR/L 10/30/2015, 1:10 PM  [facility] [address] [place], [place], [address] Phone: [phone]   Fax:  [phone]  Name: Victoria Craig MRN: [id number] Date of Birth: 1956/06/16

## 2015-10-30 NOTE — Patient Instructions (Signed)
1. Grip Strengthening (Resistive Putty)   Squeeze putty using thumb and all fingers. Repeat _20___ times. Do __2__ sessions per day.   2. Roll putty into tube on table and pinch between each finger and thumb x 10 reps each. (can do ring and small finger together)     AROM: Wrist Extension   .  With ____ palm down, bend wrist up. Repeat __15__ times per set.  Do __4-6__ sessions per day.

## 2015-11-07 ENCOUNTER — Ambulatory Visit: Payer: BLUE CROSS/BLUE SHIELD | Admitting: *Deleted

## 2015-11-13 ENCOUNTER — Encounter: Payer: Self-pay | Admitting: Occupational Therapy

## 2015-11-13 ENCOUNTER — Ambulatory Visit: Payer: BLUE CROSS/BLUE SHIELD | Attending: Orthopaedic Surgery | Admitting: Occupational Therapy

## 2015-11-13 DIAGNOSIS — M25631 Stiffness of right wrist, not elsewhere classified: Secondary | ICD-10-CM | POA: Diagnosis present

## 2015-11-13 DIAGNOSIS — R29898 Other symptoms and signs involving the musculoskeletal system: Secondary | ICD-10-CM

## 2015-11-13 DIAGNOSIS — M79642 Pain in left hand: Secondary | ICD-10-CM | POA: Diagnosis present

## 2015-11-13 DIAGNOSIS — M25632 Stiffness of left wrist, not elsewhere classified: Secondary | ICD-10-CM | POA: Diagnosis present

## 2015-11-13 DIAGNOSIS — M6281 Muscle weakness (generalized): Secondary | ICD-10-CM | POA: Diagnosis present

## 2015-11-13 DIAGNOSIS — M79641 Pain in right hand: Secondary | ICD-10-CM | POA: Diagnosis present

## 2015-11-13 NOTE — Therapy (Signed)
[facility] [address] [place], [place], [address] Phone: [phone]   Fax:  [phone]  Occupational Therapy Treatment  Patient Details  Name: Victoria Craig MRN: [id number] Date of Birth: 07-Feb-1956 Referring Provider: Marybelle Killings  Encounter Date: 11/13/2015      OT End of Session - 11/13/15 1254    Visit Number 4   Number of Visits 16   Date for OT Re-Evaluation 12/20/15   Authorization Type Blue cross blue shield (per patient, as she no longer works for [employer])   OT Start Time 0847   OT Stop Time 0930   OT Time Calculation (min) 43 min   Activity Tolerance Patient tolerated treatment well      Past Medical History  Diagnosis Date  . Essential hypertension, malignant   . Pure hypercholesterolemia   . Acute maxillary sinusitis   . Obesity, unspecified   . Mitral valve prolapse     normal ECHO 12-01-12, EF 60%  . GERD (gastroesophageal reflux disease)   . Aphthous ulcer   . Sleep disturbance   . Depression   . Arthritis   . Lateral meniscal tear     right knee    Past Surgical History  Procedure Laterality Date  . Tubal ligation    . Colonoscopy    . Cholecystectomy  2011  . Cesarean section  [id number]  . Abdominal hysterectomy  1986  . Distal biceps tendon repair Right 04/15/2013    Procedure: DISTAL BICEPS TENDON REPAIR;  Surgeon: Marybelle Killings, MD;  Location: [facility];  Service: Orthopedics;  Laterality: Right;  Right Distal Biceps Tendon Repair  . Trigger finger release Right 05/08/2014    Procedure: Excision Right Palm Mass, Trigger Finger Release Right Ring Finger;  Surgeon: Marybelle Killings, MD;  Location: [facility];  Service: Orthopedics;  Laterality: Right;  . H/o event monitor  2014    Palpitations  . Knee arthroscopy Right 04/06/2015    Procedure: Right Knee Arthroscopy, Partial Lateral Menisectomy, Debridement Parameniscal Cyst;  Surgeon: Marybelle Killings, MD;  Location: [facility];  Service: Orthopedics;  Laterality: Right;  . Knee arthroscopy with lateral menisectomy Right 04/06/2015    Procedure: KNEE ARTHROSCOPY WITH PARTIAL LATERAL MENISECTOMY;  Surgeon: Marybelle Killings, MD;  Location: [facility];  Service: Orthopedics;  Laterality: Right;    There were no vitals filed for this visit.  Visit Diagnosis:  Decreased grip strength of right hand - Plan: Ot plan of care cert/re-cert  Decreased pinch strength - Plan: Ot plan of care cert/re-cert  Pain of right hand - Plan: Ot plan of care cert/re-cert  Stiffness of wrist joint, right - Plan: Ot plan of care cert/re-cert  Pain of left hand - Plan: Ot plan of care cert/re-cert  Stiffness of wrist joint, left - Plan: Ot plan of care cert/re-cert      Subjective Assessment - 11/13/15 0854    Subjective  It still hurts really bad.   Pertinent History see epic, s/p CTR 08/06/15   Patient Stated Goals using hand again, it's very limiting    Currently in Pain? Yes   Pain Score 6    Pain Location Wrist   Pain Descriptors / Indicators Dull;Aching   Pain Type Chronic pain   Pain Onset More than a month ago   Pain Frequency Intermittent   Aggravating Factors  using hand, repetitive movement   Pain Relieving Factors rest OTC meds  Multiple Pain Sites Yes   Pain Score 9   Pain Location Arm   Pain Orientation Left   Pain Type Chronic pain   Pain Onset More than a month ago   Aggravating Factors  uisng it    Pain Relieving Factors rest, OTC meds                      OT Treatments/Exercises (OP) - 11/13/15 0001    Exercises   Exercises Hand   Hand Exercises   Other Hand Exercises Pt reports she is doing HEP at home 4 times per day however when asked to complete today pt did not seem overly familiar with exercises and had to consistently refer to sheet and required min vc's.  Pt also able to only squeeze yellow putty 5 times for gentle grip strengthening before needing a  break.  Stressed importance of completing exercises every day several times per day as well as working consistently on desentization.     Other Hand Exercises Pt c/o of pain in LUE today and states she has carpal tunnel in that hand as well. States surgeon wants to do surgery on that wrist as well however pt wishes to wait until she is done with rehab for RUE first.  Issued splint for LUE and instructed pt to complete same HEP for LUE as RUE several times per day - had pt return demonstrate exercises on both hands.  Strongly urged pt to make sure she does not wear splint 24/7 and to be consistent and compliant with HEP for both hands. Pt verbalized understanding.    Modalities   Modalities Cryotherapy   Moist Heat Therapy   Number Minutes Moist Heat 5 Minutes   Moist Heat Location Wrist   Cryotherapy   Number Minutes Cryotherapy 5 Minutes   Cryotherapy Location Wrist   Type of Cryotherapy Ice massage   Manual Therapy   Manual Therapy Joint mobilization;Soft tissue mobilization   Manual therapy comments To decrease stiffness and pain and increase ROM prior to exercise.                   OT Short Term Goals - 11/13/15 1250    OT SHORT TERM GOAL #1   Title Pt will be independent with initial HEP - check STG by 11/22/15   Time 4   Status On-going   OT SHORT TERM GOAL #2   Title Pt will increase right grip strength to 30 lbs - check STG by 11/22/15   Baseline 25lbs   Time 4   Period Weeks   Status On-going   OT SHORT TERM GOAL #3   Title Pt will increase right 3 point pinch strength to 7 lbs - check STG by 11/22/15   Baseline 2lbs   Time 4   Period Weeks   Status On-going   OT SHORT TERM GOAL #4   Title Pt will be independent with scar massage and desensitization technique - check STG by 11/22/15   Time 4   Period Weeks   Status On-going   OT SHORT TERM GOAL #5   Title Therapist and patient will look into splinting options as needed and as apporpriate - check STG by 11/22/15    Time 4   Period Weeks   Status On-going   Additional Short Term Goals   Additional Short Term Goals Yes   OT SHORT TERM GOAL #6   Title Pt will verbalize understanding of wear and care  of splint for LUE    Status New           OT Long Term Goals - 11/13/15 1250    OT LONG TERM GOAL #1   Title Pt will be independent with modified HEP   Time 8   Period Weeks   Status On-going   OT LONG TERM GOAL #2   Title Pt will have decreased pain during ADL and functional activities (less than 2/10 pain)   Time 8   Period Weeks   Status On-going   OT LONG TERM GOAL #3   Title Pt will increase right gip strength to 38lbs   Baseline 25lbs   Time 8   Period Weeks   Status On-going   OT LONG TERM GOAL #4   Title Pt will improve 3 point pinch strength to 15lbs   Baseline 2lbs    Time 8   Period Weeks   Status On-going   OT LONG TERM GOAL #5   Title Pt will report no more than 4/10 pain in LUE with functional tasks.   Status New               Plan - 11/13/15 1251    Clinical Impression Statement Pt with poor carry over from session to session. Pt reports she is completing HEP as instructed however needs continued practice and mni vc's to complete.  Pt also c/o of LUE symptoms from carpal tunnel. Have added goals and will resubmit certifcation to MD.   Pt will benefit from skilled therapeutic intervention in order to improve on the following deficits (Retired) Decreased coordination;Decreased range of motion;Decreased strength;Decreased scar mobility;Impaired sensation;Pain;Impaired UE functional use   Rehab Potential Good   Clinical Impairments Affecting Rehab Potential pain, impaired perceived functional ability   OT Duration 8 weeks   OT Treatment/Interventions Self-care/ADL training;Moist Heat;Traction;Ultrasound;Iontophoresis;Parrafin;Fluidtherapy;Contrast Bath;Therapeutic exercise;Scar mobilization;Passive range of motion;Splinting;Therapeutic exercises;Therapeutic  activities;Patient/family education   Plan fluido if in service otherwise heat, desentization, exercises, functional use of R hand   OT Home Exercise Plan Gave patient [facility] Program, including tendon gliding exercises on 10/24/2014. Issued putty HEP and desensitization techniques 10/30/15   Consulted and Agree with Plan of Care Patient        Problem List Patient Active Problem List   Diagnosis Date Noted  . Acute meniscal tear of right knee 04/06/2015  . Trigger finger, right 05/08/2014  . Rupture of left biceps tendon 04/15/2013    Quay Burow, OTR/L 11/13/2015, 12:59 PM  [facility] [address] [place], [place], [address] Phone: [phone]   Fax:  [phone]  Name: Victoria Craig MRN: [id number] Date of Birth: 07-Dec-1955

## 2015-11-21 ENCOUNTER — Ambulatory Visit: Payer: BLUE CROSS/BLUE SHIELD | Admitting: Occupational Therapy

## 2015-11-22 ENCOUNTER — Ambulatory Visit: Payer: BLUE CROSS/BLUE SHIELD | Admitting: Occupational Therapy

## 2015-11-22 ENCOUNTER — Encounter: Payer: Self-pay | Admitting: Occupational Therapy

## 2015-11-22 DIAGNOSIS — M79641 Pain in right hand: Secondary | ICD-10-CM

## 2015-11-22 NOTE — Therapy (Signed)
[facility] [address] [place], [place], [address] Phone: [phone]   Fax:  [phone]  Occupational Therapy Treatment  Patient Details  Name: Victoria Craig MRN: [id number] Date of Birth: Jan 15, 1956 Referring Provider: Marybelle Killings  Encounter Date: 11/22/2015    Past Medical History  Diagnosis Date  . Essential hypertension, malignant   . Pure hypercholesterolemia   . Acute maxillary sinusitis   . Obesity, unspecified   . Mitral valve prolapse     normal ECHO 12-01-12, EF 60%  . GERD (gastroesophageal reflux disease)   . Aphthous ulcer   . Sleep disturbance   . Depression   . Arthritis   . Lateral meniscal tear     right knee    Past Surgical History  Procedure Laterality Date  . Tubal ligation    . Colonoscopy    . Cholecystectomy  2011  . Cesarean section  [id number]  . Abdominal hysterectomy  1986  . Distal biceps tendon repair Right 04/15/2013    Procedure: DISTAL BICEPS TENDON REPAIR;  Surgeon: Marybelle Killings, MD;  Location: [facility];  Service: Orthopedics;  Laterality: Right;  Right Distal Biceps Tendon Repair  . Trigger finger release Right 05/08/2014    Procedure: Excision Right Palm Mass, Trigger Finger Release Right Ring Finger;  Surgeon: Marybelle Killings, MD;  Location: [facility];  Service: Orthopedics;  Laterality: Right;  . H/o event monitor  2014    Palpitations  . Knee arthroscopy Right 04/06/2015    Procedure: Right Knee Arthroscopy, Partial Lateral Menisectomy, Debridement Parameniscal Cyst;  Surgeon: Marybelle Killings, MD;  Location: [facility];  Service: Orthopedics;  Laterality: Right;  . Knee arthroscopy with lateral menisectomy Right 04/06/2015    Procedure: KNEE ARTHROSCOPY WITH PARTIAL LATERAL MENISECTOMY;  Surgeon: Marybelle Killings, MD;  Location: [facility];  Service: Orthopedics;  Laterality: Right;    There were no vitals filed for this  visit.  Visit Diagnosis:  Pain of right hand                              OT Short Term Goals - 11/22/15 4327    OT SHORT TERM GOAL #1   Title Pt will be independent with initial HEP - check STG by 11/22/15   Time 4   Status Not Met   OT SHORT TERM GOAL #2   Title Pt will increase right grip strength to 30 lbs - check STG by 11/22/15   Baseline 25lbs   Time 4   Period Weeks   Status Not Met   OT SHORT TERM GOAL #3   Title Pt will increase right 3 point pinch strength to 7 lbs - check STG by 11/22/15   Baseline 2lbs   Time 4   Period Weeks   Status Not Met   OT SHORT TERM GOAL #4   Title Pt will be independent with scar massage and desensitization technique - check STG by 11/22/15   Time 4   Period Weeks   Status Not Met   OT SHORT TERM GOAL #5   Title Therapist and patient will look into splinting options as needed and as apporpriate - check STG by 11/22/15   Time 4   Period Weeks   Status Not Met   OT SHORT TERM GOAL #6   Title Pt will verbalize understanding of wear and care of splint  for LUE    Status Not Met           OT Long Term Goals - 11/22/15 0722    OT LONG TERM GOAL #1   Title Pt will be independent with modified HEP   Time 8   Period Weeks   Status Not Met   OT LONG TERM GOAL #2   Title Pt will have decreased pain during ADL and functional activities (less than 2/10 pain)   Time 8   Period Weeks   Status Not Met   OT LONG TERM GOAL #3   Title Pt will increase right gip strength to 38lbs   Baseline 25lbs   Time 8   Period Weeks   Status Not Met   OT LONG TERM GOAL #4   Title Pt will improve 3 point pinch strength to 15lbs   Baseline 2lbs    Time 8   Period Weeks   Status Not Met   OT LONG TERM GOAL #5   Title Pt will report no more than 4/10 pain in LUE with functional tasks.   Status Not Met               Plan - 11/22/15 5750    Clinical Impression Statement Pt has cancelled all further appointments due to  financial reasons therefore did not meet goals.         Problem List Patient Active Problem List   Diagnosis Date Noted  . Acute meniscal tear of right knee 04/06/2015  . Trigger finger, right 05/08/2014  . Rupture of left biceps tendon 04/15/2013   OCCUPATIONAL THERAPY DISCHARGE SUMMARY  Visits from Start of Care: 3  Current functional level related to goals / functional outcomes: See above   Remaining deficits: See initial eval - pt only attended 3 sessions   Education / Equipment: HEP, splint for LUE Plan: Patient agrees to discharge.  Patient goals were not met. Patient is being discharged due to financial reasons.  ?????   PT has cancelled all further appointments due to financial reasons.   Quay Burow, OTR/L 11/22/2015, 8:34 AM  [facility] [address] [place], [place], [address] Phone: [phone]   Fax:  [phone]  Name: TONEY LIZAOLA MRN: [id number] Date of Birth: 07/16/1956

## 2015-11-26 ENCOUNTER — Encounter: Payer: 59 | Admitting: Occupational Therapy

## 2015-11-27 ENCOUNTER — Encounter: Payer: 59 | Admitting: Occupational Therapy

## 2015-12-05 ENCOUNTER — Encounter: Payer: 59 | Admitting: Occupational Therapy

## 2015-12-06 ENCOUNTER — Encounter: Payer: 59 | Admitting: Occupational Therapy

## 2015-12-10 ENCOUNTER — Encounter: Payer: 59 | Admitting: Occupational Therapy

## 2015-12-11 ENCOUNTER — Encounter: Payer: 59 | Admitting: Occupational Therapy

## 2015-12-19 ENCOUNTER — Encounter: Payer: 59 | Admitting: Occupational Therapy

## 2015-12-20 ENCOUNTER — Encounter: Payer: 59 | Admitting: Occupational Therapy

## 2015-12-24 ENCOUNTER — Encounter: Payer: 59 | Admitting: Occupational Therapy

## 2015-12-25 ENCOUNTER — Encounter: Payer: 59 | Admitting: Occupational Therapy

## 2016-04-17 ENCOUNTER — Encounter (HOSPITAL_COMMUNITY): Payer: Self-pay | Admitting: Emergency Medicine

## 2016-04-17 ENCOUNTER — Ambulatory Visit (HOSPITAL_COMMUNITY)
Admission: EM | Admit: 2016-04-17 | Discharge: 2016-04-17 | Disposition: A | Discharge status: Home / Self Care | Payer: BLUE CROSS/BLUE SHIELD

## 2016-04-17 DIAGNOSIS — L723 Sebaceous cyst: Secondary | ICD-10-CM

## 2016-04-17 MED ORDER — HYDROCODONE-ACETAMINOPHEN 5-325 MG PO TABS: 2.0000 | ORAL_TABLET | ORAL | 0 refills | Status: DC | PRN

## 2016-04-17 MED ORDER — SULFAMETHOXAZOLE-TRIMETHOPRIM 800-160 MG PO TABS: 1.0000 | ORAL_TABLET | Freq: Two times a day (BID) | ORAL | 0 refills | Status: AC

## 2016-04-17 NOTE — ED Triage Notes (Signed)
Here for a cyst/abscess on back onset x2 weeks  Reports she's on Keflex for lymphadenopathy given by ENT  A&O x4... NAD

## 2016-04-17 NOTE — ED Provider Notes (Signed)
CSN: [id number]     Arrival date & time 04/17/16  1407 History   None    Chief Complaint  Patient presents with  . Cyst   (Consider location/radiation/quality/duration/timing/severity/associated sxs/prior Treatment) Patient has a bump on her back that is painful and she wants it drained.   The history is provided by the patient.  Abscess  Location:  Torso Torso abscess location:  Upper back Abscess quality: draining, fluctuance, painful, redness and warmth   Red streaking: no   Duration:  2 weeks Progression:  Worsening Pain details:    Quality:  Aching   Severity:  Moderate   Duration:  2 weeks   Progression:  Worsening Chronicity:  New Relieved by:  Nothing Worsened by:  Nothing Ineffective treatments:  Warm compresses   Past Medical History:  Diagnosis Date  . Acute maxillary sinusitis   . Aphthous ulcer   . Arthritis   . Depression   . Essential hypertension, malignant   . GERD (gastroesophageal reflux disease)   . Lateral meniscal tear    right knee  . Mitral valve prolapse    normal ECHO 12-01-12, EF 60%  . Obesity, unspecified   . Pure hypercholesterolemia   . Sleep disturbance    Past Surgical History:  Procedure Laterality Date  . ABDOMINAL HYSTERECTOMY  1986  . CESAREAN SECTION  77,81  . CHOLECYSTECTOMY  2011  . COLONOSCOPY    . DISTAL BICEPS TENDON REPAIR Right 04/15/2013   Procedure: DISTAL BICEPS TENDON REPAIR;  Surgeon: Marybelle Killings, MD;  Location: [facility];  Service: Orthopedics;  Laterality: Right;  Right Distal Biceps Tendon Repair  . H/O Event monitor  2014   Palpitations  . KNEE ARTHROSCOPY Right 04/06/2015   Procedure: Right Knee Arthroscopy, Partial Lateral Menisectomy, Debridement Parameniscal Cyst;  Surgeon: Marybelle Killings, MD;  Location: [facility];  Service: Orthopedics;  Laterality: Right;  . KNEE ARTHROSCOPY WITH LATERAL MENISECTOMY Right 04/06/2015   Procedure: KNEE ARTHROSCOPY WITH PARTIAL LATERAL  MENISECTOMY;  Surgeon: Marybelle Killings, MD;  Location: [facility];  Service: Orthopedics;  Laterality: Right;  . TRIGGER FINGER RELEASE Right 05/08/2014   Procedure: Excision Right Palm Mass, Trigger Finger Release Right Ring Finger;  Surgeon: Marybelle Killings, MD;  Location: [facility];  Service: Orthopedics;  Laterality: Right;  . TUBAL LIGATION     Family History  Problem Relation Age of Onset  . Heart disease Mother   . Heart disease Father   . Hypertension Brother   . Cancer Brother   . Stroke Maternal Grandmother   . Heart disease Maternal Grandfather   . Heart disease Paternal Grandmother    Social History  Substance Use Topics  . Smoking status: Never Smoker  . Smokeless tobacco: Never Used  . Alcohol use Yes     Comment: Occ glass of wine   OB History    No data available     Review of Systems  Constitutional: Negative.   HENT: Negative.   Eyes: Negative.   Respiratory: Negative.   Cardiovascular: Negative.   Gastrointestinal: Negative.   Endocrine: Negative.   Genitourinary: Negative.   Musculoskeletal: Negative.   Skin: Positive for wound.  Allergic/Immunologic: Negative.   Neurological: Negative.   Hematological: Negative.   Psychiatric/Behavioral: Negative.     Allergies  Review of patient's allergies indicates no known allergies.  Home Medications   Prior to Admission medications   Medication Sig Start Date End Date Taking? Authorizing Provider  diltiazem (CARTIA XT) 120 MG 24 hr capsule Take 120 mg by mouth daily.   Yes Historical Provider, MD  estradiol (VIVELLE-DOT) 0.1 MG/24HR Place 1 patch onto the skin 2 (two) times a week. New patch on Thursday and Monday   Yes Historical Provider, MD  losartan (COZAAR) 50 MG tablet Take 50 mg by mouth daily.   Yes Historical Provider, MD  metoprolol succinate (TOPROL-XL) 25 MG 24 hr tablet Take 25 mg by mouth daily.   Yes Historical Provider, MD  triamterene-hydrochlorothiazide (DYAZIDE) 37.5-25 MG per  capsule Take 1 capsule by mouth daily.    Yes Historical Provider, MD  HYDROcodone-acetaminophen (NORCO/VICODIN) 5-325 MG per tablet Take 1 tablet by mouth every 4 (four) hours as needed. 04/02/15   Melony Overly, MD  HYDROcodone-acetaminophen (NORCO/VICODIN) 5-325 MG tablet Take 2 tablets by mouth every 4 (four) hours as needed. 04/17/16   Lysbeth Penner, FNP  influenza vac recombinant HA trivalent (FLUBLOK) injection Inject 0.5 mLs into the muscle once.    Historical Provider, MD  omeprazole (PRILOSEC) 20 MG capsule TAKE 1 CAPSULE BY MOUTH TWICE A DAY BEFORE A MEAL (PLEASE MAKE APPOINTMENT) 07/31/14   Lorretta Harp, MD  oxyCODONE-acetaminophen (ROXICET) 5-325 MG per tablet Take 2 tablets by mouth every 4 (four) hours as needed for severe pain. Patient not taking: Reported on 10/25/2015 04/06/15   Marybelle Killings, MD  sertraline (ZOLOFT) 100 MG tablet Take 100 mg by mouth daily.    Historical Provider, MD  sulfamethoxazole-trimethoprim (BACTRIM DS,SEPTRA DS) 800-160 MG tablet Take 1 tablet by mouth 2 (two) times daily. 04/17/16 04/24/16  Lysbeth Penner, FNP  zolpidem (AMBIEN) 5 MG tablet Take 5 mg by mouth at bedtime as needed. For sleep    Historical Provider, MD   Meds Ordered and Administered this Visit  Medications - No data to display  BP 136/93 (BP Location: Right Wrist)   Pulse 88   Temp 97.9 F (36.6 C) (Oral)   Resp 16   SpO2 98%  No data found.   Physical Exam  Constitutional: She appears well-developed and well-nourished.  HENT:  Head: Normocephalic and atraumatic.  Right Ear: External ear normal.  Left Ear: External ear normal.  Mouth/Throat: Oropharynx is clear and moist.  Eyes: Conjunctivae and EOM are normal. Pupils are equal, round, and reactive to light.  Neck: Normal range of motion. Neck supple.  Cardiovascular: Normal rate, regular rhythm and normal heart sounds.   Pulmonary/Chest: Effort normal and breath sounds normal.  Skin:  Sebaceous Cyst on Back  Nursing  note and vitals reviewed.   Urgent Care Course   Clinical Course    .Marland KitchenIncision and Drainage Date/Time: 04/17/2016 4:27 PM Performed by: Lysbeth Penner Authorized by: Lysbeth Penner   Consent:    Consent obtained:  Verbal   Consent given by:  Patient   Risks discussed:  Bleeding   Alternatives discussed:  No treatment Location:    Type:  Abscess   Size:  4 cm   Location:  Trunk   Trunk location:  Back Pre-procedure details:    Skin preparation:  Betadine Anesthesia (see MAR for exact dosages):    Anesthesia method:  Local infiltration   Local anesthetic:  Lidocaine 2% WITH epi Procedure type:    Complexity:  Simple Procedure details:    Needle aspiration: no     Incision types:  Stab incision   Incision depth:  Submucosal   Scalpel blade:  11   Wound management:  Probed and deloculated   Drainage:  Serosanguinous   Drainage amount:  Moderate   Wound treatment:  Wound left open Post-procedure details:    Patient tolerance of procedure:  Tolerated well, no immediate complications   (including critical care time)  Labs Review Labs Reviewed - No data to display  Imaging Review No results found.   Visual Acuity Review  Right Eye Distance:   Left Eye Distance:   Bilateral Distance:    Right Eye Near:   Left Eye Near:    Bilateral Near:         MDM   1. Sebaceous cyst    Bactrim DS one po bid x 10 days #20 Change dressing qd. Follow up prn if any signs and symptoms of infection.    Lysbeth Penner, FNP 04/17/16 1630

## 2016-04-29 ENCOUNTER — Other Ambulatory Visit: Payer: Self-pay | Admitting: Internal Medicine

## 2016-04-29 DIAGNOSIS — Z1231 Encounter for screening mammogram for malignant neoplasm of breast: Secondary | ICD-10-CM

## 2016-05-09 ENCOUNTER — Ambulatory Visit
Admission: RE | Admit: 2016-05-09 | Discharge: 2016-05-09 | Disposition: A | Discharge status: Home / Self Care | Payer: BLUE CROSS/BLUE SHIELD | Source: Ambulatory Visit | Attending: Internal Medicine | Admitting: Internal Medicine

## 2016-05-09 DIAGNOSIS — Z1231 Encounter for screening mammogram for malignant neoplasm of breast: Secondary | ICD-10-CM

## 2016-11-14 DIAGNOSIS — R51 Headache: Secondary | ICD-10-CM | POA: Diagnosis not present

## 2016-11-14 DIAGNOSIS — G44009 Cluster headache syndrome, unspecified, not intractable: Secondary | ICD-10-CM | POA: Diagnosis not present

## 2016-11-14 DIAGNOSIS — H6691 Otitis media, unspecified, right ear: Secondary | ICD-10-CM | POA: Diagnosis not present

## 2016-11-14 DIAGNOSIS — J309 Allergic rhinitis, unspecified: Secondary | ICD-10-CM | POA: Diagnosis not present

## 2017-01-16 DIAGNOSIS — L309 Dermatitis, unspecified: Secondary | ICD-10-CM | POA: Diagnosis not present

## 2017-06-29 DIAGNOSIS — H25013 Cortical age-related cataract, bilateral: Secondary | ICD-10-CM | POA: Diagnosis not present

## 2017-06-29 DIAGNOSIS — H40013 Open angle with borderline findings, low risk, bilateral: Secondary | ICD-10-CM | POA: Diagnosis not present

## 2017-06-29 DIAGNOSIS — H40033 Anatomical narrow angle, bilateral: Secondary | ICD-10-CM | POA: Diagnosis not present

## 2017-06-29 DIAGNOSIS — H35032 Hypertensive retinopathy, left eye: Secondary | ICD-10-CM | POA: Diagnosis not present

## 2017-06-29 DIAGNOSIS — H353131 Nonexudative age-related macular degeneration, bilateral, early dry stage: Secondary | ICD-10-CM | POA: Diagnosis not present

## 2017-06-29 DIAGNOSIS — H353121 Nonexudative age-related macular degeneration, left eye, early dry stage: Secondary | ICD-10-CM | POA: Diagnosis not present

## 2017-06-29 DIAGNOSIS — H353111 Nonexudative age-related macular degeneration, right eye, early dry stage: Secondary | ICD-10-CM | POA: Diagnosis not present

## 2017-06-29 DIAGNOSIS — H35031 Hypertensive retinopathy, right eye: Secondary | ICD-10-CM | POA: Diagnosis not present

## 2017-12-10 DIAGNOSIS — R7303 Prediabetes: Secondary | ICD-10-CM | POA: Diagnosis not present

## 2017-12-10 DIAGNOSIS — I10 Essential (primary) hypertension: Secondary | ICD-10-CM | POA: Diagnosis not present

## 2017-12-10 DIAGNOSIS — E559 Vitamin D deficiency, unspecified: Secondary | ICD-10-CM | POA: Diagnosis not present

## 2017-12-10 DIAGNOSIS — K1230 Oral mucositis (ulcerative), unspecified: Secondary | ICD-10-CM | POA: Diagnosis not present

## 2017-12-29 ENCOUNTER — Ambulatory Visit (INDEPENDENT_AMBULATORY_CARE_PROVIDER_SITE_OTHER): Payer: BLUE CROSS/BLUE SHIELD

## 2017-12-29 ENCOUNTER — Other Ambulatory Visit (INDEPENDENT_AMBULATORY_CARE_PROVIDER_SITE_OTHER): Payer: Self-pay

## 2017-12-29 ENCOUNTER — Telehealth (INDEPENDENT_AMBULATORY_CARE_PROVIDER_SITE_OTHER): Payer: Self-pay | Admitting: Physician Assistant

## 2017-12-29 ENCOUNTER — Encounter (INDEPENDENT_AMBULATORY_CARE_PROVIDER_SITE_OTHER): Payer: Self-pay | Admitting: Physician Assistant

## 2017-12-29 ENCOUNTER — Ambulatory Visit (INDEPENDENT_AMBULATORY_CARE_PROVIDER_SITE_OTHER): Payer: BLUE CROSS/BLUE SHIELD | Admitting: Physician Assistant

## 2017-12-29 DIAGNOSIS — M25511 Pain in right shoulder: Secondary | ICD-10-CM

## 2017-12-29 DIAGNOSIS — G8929 Other chronic pain: Secondary | ICD-10-CM | POA: Diagnosis not present

## 2017-12-29 MED ORDER — BUPIVACAINE HCL 0.25 % IJ SOLN
2.0000 mL | INTRAMUSCULAR | Status: AC | PRN
  Administered 2017-12-29: 2 mL via INTRA_ARTICULAR

## 2017-12-29 MED ORDER — METHYLPREDNISOLONE ACETATE 40 MG/ML IJ SUSP
40.0000 mg | INTRAMUSCULAR | Status: AC | PRN
  Administered 2017-12-29: 40 mg via INTRA_ARTICULAR

## 2017-12-29 MED ORDER — LIDOCAINE HCL 2 % IJ SOLN
2.0000 mL | INTRAMUSCULAR | Status: AC | PRN
  Administered 2017-12-29: 2 mL

## 2017-12-29 MED ORDER — TRAMADOL HCL 50 MG PO TABS: ORAL_TABLET | ORAL | 0 refills | Status: DC

## 2017-12-29 NOTE — Telephone Encounter (Signed)
Please advise 

## 2017-12-29 NOTE — Telephone Encounter (Signed)
Can you call in tramadol?  50mg  take 1-2 tabs po q6-8 hours prn pain #40, no refills

## 2017-12-29 NOTE — Telephone Encounter (Signed)
Patient saw [name] today, said she forgot to request something for pain. She said since shes been home it has been bothering her more due to inflammation. Can anything be called in for her? Patients # [id number]

## 2017-12-29 NOTE — Progress Notes (Signed)
Office Visit Note   Patient: Victoria Craig           Date of Birth: 1955/10/20           MRN: [id number] Visit Date: 12/29/2017              Requested by: Rogers Blocker, MD [address] [place], [place] [address] PCP: Rogers Blocker, MD   Assessment & Plan: Visit Diagnoses:  1. Chronic right shoulder pain     Plan: Impression is right shoulder subacromial bursitis.  Today, we will inject her subacromial space with cortisone.  She will take it easy over the next few days.  We will also provide the patient with a job exercise program.  She is not better over the next several weeks she will call and let us know.  She will call with concerns or questions in the meantime.  Follow-Up Instructions: Return if symptoms worsen or fail to improve.   Orders:  Orders Placed This Encounter  Procedures  . Large Joint Inj: R subacromial bursa  . XR Shoulder Right   No orders of the defined types were placed in this encounter.     Procedures: Large Joint Inj: R subacromial bursa on 12/29/2017 8:29 AM Indications: pain Details: 22 G needle Medications: 2 mL lidocaine 2 %; 2 mL bupivacaine 0.25 %; 40 mg methylPREDNISolone acetate 40 MG/ML Outcome: tolerated well, no immediate complications Patient was prepped and draped in the usual sterile fashion.       Clinical Data: No additional findings.   Subjective: Chief Complaint  Patient presents with  . Right Shoulder - Pain    HPI patient is a pleasant 62 year old female presents to our clinic today with right shoulder pain.  This began approximately 1 month ago when she was moving about 100 bricks building a patio by herself.  Since then she has had intermittent pain to the top of her shoulder.  This is worse with a abduction of the shoulder.  She has used ice and Norco which is given her significant relief.  No numbness tingling burning.  No previous injury to either shoulder.  Review of Systems as detailed in HPI.   All others are negative.   Objective: Vital Signs: There were no vitals taken for this visit.  Physical Exam well-developed well-nourished female no acute distress.  Alert and oriented x3.  Ortho Exam examination of the right shoulder reveals full active range of motion in all planes.  A little pain with internal rotation but she can still get to T12.  Positive empty can and cross body abduction.  Specialty Comments:  No specialty comments available.  Imaging: Xr Shoulder Right  Result Date: 12/29/2017 She does have evidence of AC arthropathy but no other structural changes    PMFS History: Patient Active Problem List   Diagnosis Date Noted  . Chronic right shoulder pain 12/29/2017  . Acute meniscal tear of right knee 04/06/2015  . Trigger finger, right 05/08/2014  . Rupture of left biceps tendon 04/15/2013   Past Medical History:  Diagnosis Date  . Acute maxillary sinusitis   . Aphthous ulcer   . Arthritis   . Depression   . Essential hypertension, malignant   . GERD (gastroesophageal reflux disease)   . Lateral meniscal tear    right knee  . Mitral valve prolapse    normal ECHO 12-01-12, EF 60%  . Obesity, unspecified   . Pure hypercholesterolemia   . Sleep disturbance  Family History  Problem Relation Age of Onset  . Heart disease Mother   . Heart disease Father   . Hypertension Brother   . Cancer Brother   . Stroke Maternal Grandmother   . Heart disease Maternal Grandfather   . Heart disease Paternal Grandmother     Past Surgical History:  Procedure Laterality Date  . ABDOMINAL HYSTERECTOMY  1986  . CESAREAN SECTION  77,81  . CHOLECYSTECTOMY  2011  . COLONOSCOPY    . DISTAL BICEPS TENDON REPAIR Right 04/15/2013   Procedure: DISTAL BICEPS TENDON REPAIR;  Surgeon: Marybelle Killings, MD;  Location: [facility];  Service: Orthopedics;  Laterality: Right;  Right Distal Biceps Tendon Repair  . H/O Event monitor  2014   Palpitations  . KNEE  ARTHROSCOPY Right 04/06/2015   Procedure: Right Knee Arthroscopy, Partial Lateral Menisectomy, Debridement Parameniscal Cyst;  Surgeon: Marybelle Killings, MD;  Location: [facility];  Service: Orthopedics;  Laterality: Right;  . KNEE ARTHROSCOPY WITH LATERAL MENISECTOMY Right 04/06/2015   Procedure: KNEE ARTHROSCOPY WITH PARTIAL LATERAL MENISECTOMY;  Surgeon: Marybelle Killings, MD;  Location: [facility];  Service: Orthopedics;  Laterality: Right;  . TRIGGER FINGER RELEASE Right 05/08/2014   Procedure: Excision Right Palm Mass, Trigger Finger Release Right Ring Finger;  Surgeon: Marybelle Killings, MD;  Location: [facility];  Service: Orthopedics;  Laterality: Right;  . TUBAL LIGATION     Social History   Occupational History  . Not on file  Tobacco Use  . Smoking status: Never Smoker  . Smokeless tobacco: Never Used  Substance and Sexual Activity  . Alcohol use: Yes    Comment: Occ glass of wine  . Drug use: No  . Sexual activity: Not on file

## 2017-12-29 NOTE — Telephone Encounter (Signed)
Called into pharm  

## 2018-01-15 ENCOUNTER — Ambulatory Visit (INDEPENDENT_AMBULATORY_CARE_PROVIDER_SITE_OTHER): Payer: BLUE CROSS/BLUE SHIELD | Admitting: Orthopaedic Surgery

## 2018-01-15 ENCOUNTER — Encounter (INDEPENDENT_AMBULATORY_CARE_PROVIDER_SITE_OTHER): Payer: Self-pay | Admitting: Orthopaedic Surgery

## 2018-01-15 VITALS — BP 155/94 | HR 88 | Ht 59.0 in | Wt 200.0 lb

## 2018-01-15 DIAGNOSIS — M7541 Impingement syndrome of right shoulder: Secondary | ICD-10-CM

## 2018-01-15 NOTE — Progress Notes (Signed)
Office Visit Note   Patient: Victoria Craig           Date of Birth: April 17, 1956           MRN: [id number] Visit Date: 01/15/2018              Requested by: Rogers Blocker, MD [address] [place], [place] [address] PCP: Rogers Blocker, MD   Assessment & Plan: Visit Diagnoses:  1. Impingement syndrome of right shoulder     Plan: She will continue walking activities to avoid outstretched and overhead activities appear to time.  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  I will check her back again in a month if she is having persistent symptoms we will consider diagnostic imaging.  Follow-Up Instructions: Return in about 1 month (around 02/15/2018).   Orders:  No orders of the defined types were placed in this encounter.  No orders of the defined types were placed in this encounter.     Procedures: No procedures performed   Clinical Data: No additional findings.   Subjective: Chief Complaint  Patient presents with  . Right Shoulder - Pain    HPI 62 year old female returns with ongoing problems with the right shoulder that began after she got some brick and stacked up and built a retaining wall at her house.  She had increased pain in her shoulder pain with clicking and had a subacromial injection.  She did not notice any particular relief from the injection at times when she moves her arm overhead or abducted she feels sharp pain and discomfort.  The brick work was something that would be unusual for her.  She had been told by her medical doctor she needed to be outside and get more active and work on weight loss.  Patient denies chills or fever she denies associated neck pain some the discomfort radiates up to the right trapezial region.  Right subacromial injection was on 12/29/2017.  Review of Systems 14 point review of systems updated unchanged from last office visit.  She has had previous rupture  of the left biceps tendon.  Treatment for trigger finger.  Meniscal tear.  Otherwise negative as it pertains to HPI.   Objective: Vital Signs: BP (!) 155/94   Pulse 88   Ht 4\' 11"  (1.499 m)   Wt 200 lb (90.7 kg)   BMI 40.40 kg/m   Physical Exam  Constitutional: She is oriented to person, place, and time. She appears well-developed.  HENT:  Head: Normocephalic.  Right Ear: External ear normal.  Left Ear: External ear normal.  Eyes: Pupils are equal, round, and reactive to light.  Neck: No tracheal deviation present. No thyromegaly present.  Cardiovascular: Normal rate.  Pulmonary/Chest: Effort normal.  Abdominal: Soft.  Neurological: She is alert and oriented to person, place, and time.  Skin: Skin is warm and dry.  Psychiatric: She has a normal mood and affect. Her behavior is normal.    Ortho Exam patient has an intact subscap external rotation negative drop arm test.  She has some crepitus with range of motion of her shoulder.  Long head of the biceps is normal.  Mild tenderness at the acromioclavicular joint.  Negative crossarm test.  Upper extremity reflexes are 2+ sensation her hand is normal normal heel toe gait.  Specialty Comments:  No specialty comments available.  Imaging: No results found.   PMFS History: Patient Active Problem List  Diagnosis Date Noted  . Chronic right shoulder pain 12/29/2017  . Acute meniscal tear of right knee 04/06/2015  . Trigger finger, right 05/08/2014  . Rupture of left biceps tendon 04/15/2013   Past Medical History:  Diagnosis Date  . Acute maxillary sinusitis   . Aphthous ulcer   . Arthritis   . Depression   . Essential hypertension, malignant   . GERD (gastroesophageal reflux disease)   . Lateral meniscal tear    right knee  . Mitral valve prolapse    normal ECHO 12-01-12, EF 60%  . Obesity, unspecified   . Pure hypercholesterolemia   . Sleep disturbance     Family History  Problem Relation Age of Onset  . Heart  disease Mother   . Heart disease Father   . Hypertension Brother   . Cancer Brother   . Stroke Maternal Grandmother   . Heart disease Maternal Grandfather   . Heart disease Paternal Grandmother     Past Surgical History:  Procedure Laterality Date  . ABDOMINAL HYSTERECTOMY  1986  . CESAREAN SECTION  77,81  . CHOLECYSTECTOMY  2011  . COLONOSCOPY    . DISTAL BICEPS TENDON REPAIR Right 04/15/2013   Procedure: DISTAL BICEPS TENDON REPAIR;  Surgeon: Marybelle Killings, MD;  Location: [facility];  Service: Orthopedics;  Laterality: Right;  Right Distal Biceps Tendon Repair  . H/O Event monitor  2014   Palpitations  . KNEE ARTHROSCOPY Right 04/06/2015   Procedure: Right Knee Arthroscopy, Partial Lateral Menisectomy, Debridement Parameniscal Cyst;  Surgeon: Marybelle Killings, MD;  Location: [facility];  Service: Orthopedics;  Laterality: Right;  . KNEE ARTHROSCOPY WITH LATERAL MENISECTOMY Right 04/06/2015   Procedure: KNEE ARTHROSCOPY WITH PARTIAL LATERAL MENISECTOMY;  Surgeon: Marybelle Killings, MD;  Location: [facility];  Service: Orthopedics;  Laterality: Right;  . TRIGGER FINGER RELEASE Right 05/08/2014   Procedure: Excision Right Palm Mass, Trigger Finger Release Right Ring Finger;  Surgeon: Marybelle Killings, MD;  Location: [facility];  Service: Orthopedics;  Laterality: Right;  . TUBAL LIGATION     Social History   Occupational History  . Not on file  Tobacco Use  . Smoking status: Never Smoker  . Smokeless tobacco: Never Used  Substance and Sexual Activity  . Alcohol use: Yes    Comment: Occ glass of wine  . Drug use: No  . Sexual activity: Not on file

## 2018-01-22 ENCOUNTER — Encounter (HOSPITAL_COMMUNITY): Payer: Self-pay

## 2018-01-22 ENCOUNTER — Ambulatory Visit (HOSPITAL_COMMUNITY)
Admission: EM | Admit: 2018-01-22 | Discharge: 2018-01-22 | Disposition: A | Discharge status: Home / Self Care | Payer: BLUE CROSS/BLUE SHIELD | Attending: Family Medicine | Admitting: Family Medicine

## 2018-01-22 DIAGNOSIS — Z91038 Other insect allergy status: Secondary | ICD-10-CM

## 2018-01-22 DIAGNOSIS — R21 Rash and other nonspecific skin eruption: Secondary | ICD-10-CM

## 2018-01-22 DIAGNOSIS — S50861A Insect bite (nonvenomous) of right forearm, initial encounter: Secondary | ICD-10-CM

## 2018-01-22 DIAGNOSIS — S50862A Insect bite (nonvenomous) of left forearm, initial encounter: Secondary | ICD-10-CM | POA: Diagnosis not present

## 2018-01-22 MED ORDER — HYDROXYZINE HCL 25 MG PO TABS: 25.0000 mg | ORAL_TABLET | Freq: Four times a day (QID) | ORAL | 0 refills | Status: DC

## 2018-01-22 MED ORDER — PREDNISONE 20 MG PO TABS: 20.0000 mg | ORAL_TABLET | Freq: Two times a day (BID) | ORAL | 0 refills | Status: DC

## 2018-01-22 NOTE — Discharge Instructions (Signed)
Ice Take the prednisone as directed Take the atarax for itching Return as needed

## 2018-01-22 NOTE — ED Triage Notes (Signed)
Presents with complaints of bug bite to right arm x 4 days ago. Area is red the size of a baseball.

## 2018-01-22 NOTE — ED Provider Notes (Signed)
[facility]    CSN: [id number] Arrival date & time: 01/22/18  1554     History   Chief Complaint No chief complaint on file.   HPI Victoria Craig is a 62 y.o. female.   HPI  Patient was working out in her garden.  She has unknown insect bites to both of her forearms.  The right antecubital fossa has a very red confluent area that is itching and stinging.  No pain.  No fever.  Never had this reaction before.  No known allergies to insects.  Never had a plant allergy like poison ivy.  Facial swelling.  Voice change.  Difficulty swallowing.  Shortness of breath  Past Medical History:  Diagnosis Date  . Acute maxillary sinusitis   . Aphthous ulcer   . Arthritis   . Depression   . Essential hypertension, malignant   . GERD (gastroesophageal reflux disease)   . Lateral meniscal tear    right knee  . Mitral valve prolapse    normal ECHO 12-01-12, EF 60%  . Obesity, unspecified   . Pure hypercholesterolemia   . Sleep disturbance     Patient Active Problem List   Diagnosis Date Noted  . Acute meniscal tear of right knee 04/06/2015  . Trigger finger, right 05/08/2014  . Rupture of left biceps tendon 04/15/2013    Past Surgical History:  Procedure Laterality Date  . ABDOMINAL HYSTERECTOMY  1986  . CESAREAN SECTION  77,81  . CHOLECYSTECTOMY  2011  . COLONOSCOPY    . DISTAL BICEPS TENDON REPAIR Right 04/15/2013   Procedure: DISTAL BICEPS TENDON REPAIR;  Surgeon: Marybelle Killings, MD;  Location: [facility];  Service: Orthopedics;  Laterality: Right;  Right Distal Biceps Tendon Repair  . H/O Event monitor  2014   Palpitations  . KNEE ARTHROSCOPY Right 04/06/2015   Procedure: Right Knee Arthroscopy, Partial Lateral Menisectomy, Debridement Parameniscal Cyst;  Surgeon: Marybelle Killings, MD;  Location: [facility];  Service: Orthopedics;  Laterality: Right;  . KNEE ARTHROSCOPY WITH LATERAL MENISECTOMY Right 04/06/2015   Procedure: KNEE  ARTHROSCOPY WITH PARTIAL LATERAL MENISECTOMY;  Surgeon: Marybelle Killings, MD;  Location: [facility];  Service: Orthopedics;  Laterality: Right;  . TRIGGER FINGER RELEASE Right 05/08/2014   Procedure: Excision Right Palm Mass, Trigger Finger Release Right Ring Finger;  Surgeon: Marybelle Killings, MD;  Location: [facility];  Service: Orthopedics;  Laterality: Right;  . TUBAL LIGATION      OB History   None      Home Medications    Prior to Admission medications   Medication Sig Start Date End Date Taking? Authorizing Provider  diltiazem (CARTIA XT) 120 MG 24 hr capsule Take 120 mg by mouth daily.   Yes [provider]  doxycycline (VIBRA-TABS) 100 MG tablet  01/05/18  Yes [provider]  estradiol (VIVELLE-DOT) 0.1 MG/24HR Place 1 patch onto the skin 2 (two) times a week. New patch on Thursday and Monday   Yes [provider]  losartan (COZAAR) 50 MG tablet Take 50 mg by mouth daily.   Yes [provider]  metoprolol succinate (TOPROL-XL) 25 MG 24 hr tablet Take 25 mg by mouth daily.   Yes [provider]  triamterene-hydrochlorothiazide (DYAZIDE) 37.5-25 MG per capsule Take 1 capsule by mouth daily.    Yes [provider]  hydrOXYzine (ATARAX/VISTARIL) 25 MG tablet Take 1 tablet (25 mg total) by mouth every 6 (six) hours. 01/22/18  Raylene Everts, MD  influenza vac recombinant HA trivalent (FLUBLOK) injection Inject 0.5 mLs into the muscle once.    [provider]  omeprazole (PRILOSEC) 20 MG capsule TAKE 1 CAPSULE BY MOUTH TWICE A DAY BEFORE A MEAL (PLEASE MAKE APPOINTMENT) 07/31/14   Lorretta Harp, MD  predniSONE (DELTASONE) 20 MG tablet Take 1 tablet (20 mg total) by mouth 2 (two) times daily with a meal. 01/22/18   Raylene Everts, MD  sertraline (ZOLOFT) 100 MG tablet Take 100 mg by mouth daily.    [provider]  zolpidem (AMBIEN) 5 MG tablet Take 5 mg by mouth at bedtime as needed. For sleep     [provider]    Family History Family History  Problem Relation Age of Onset  . Heart disease Mother   . Heart disease Father   . Hypertension Brother   . Cancer Brother   . Stroke Maternal Grandmother   . Heart disease Maternal Grandfather   . Heart disease Paternal Grandmother     Social History Social History   Tobacco Use  . Smoking status: Never Smoker  . Smokeless tobacco: Never Used  Substance Use Topics  . Alcohol use: Yes    Comment: Occ glass of wine  . Drug use: No     Allergies   Patient has no known allergies.   Review of Systems Review of Systems  Constitutional: Negative for chills and fever.  HENT: Negative for congestion, dental problem, trouble swallowing and voice change.   Eyes: Negative for pain and visual disturbance.  Respiratory: Negative for cough and shortness of breath.   Cardiovascular: Negative for chest pain and palpitations.  Gastrointestinal: Negative for abdominal pain and vomiting.  Genitourinary: Negative for dysuria and hematuria.  Musculoskeletal: Negative for arthralgias and back pain.  Skin: Positive for rash. Negative for color change.  Neurological: Negative for seizures and syncope.  All other systems reviewed and are negative.    Physical Exam Triage Vital Signs ED Triage Vitals [01/22/18 1639]  Enc Vitals Group     BP 119/64     Pulse Rate (!) 102     Resp 18     Temp 98.6 F (37 C)     Temp src      SpO2 100 %     Weight      Height      Head Circumference      Peak Flow      Pain Score 7     Pain Loc      Pain Edu?      Excl. in [place]?    No data found.  Updated Vital Signs BP 119/64   Pulse (!) 102   Temp 98.6 F (37 C)   Resp 18   SpO2 100%   Visual Acuity Right Eye Distance:   Left Eye Distance:   Bilateral Distance:    Right Eye Near:   Left Eye Near:    Bilateral Near:     Physical Exam  Constitutional: She appears well-developed and well-nourished. No distress.  HENT:    Head: Normocephalic and atraumatic.  Mouth/Throat: Oropharynx is clear and moist.  Eyes: Conjunctivae are normal.  Neck: Neck supple.  Cardiovascular: Normal rate and regular rhythm.  No murmur heard. Pulmonary/Chest: Effort normal and breath sounds normal. No respiratory distress.  Abdominal: Soft. She exhibits distension.  Musculoskeletal: She exhibits no edema.  Neurological: She is alert.  Skin: Skin is warm and dry.  Right  antecubital fossa has a large area of induration, confluent erythema that measures 6 x 7 cm.  Multiple smaller papules surrounding consistent with bites.  Left forearm has several areas of vesicles that appear to be a contact dermatitis.  Psychiatric: She has a normal mood and affect.  Nursing note and vitals reviewed.    UC Treatments / Results  Labs (all labs ordered are listed, but only abnormal results are displayed) Labs Reviewed - No data to display  EKG None  Radiology No results found.  Procedures Procedures (including critical care time)  Medications Ordered in UC Medications - No data to display  Initial Impression / Assessment and Plan / UC Course  I have reviewed the triage vital signs and the nursing notes.  Pertinent labs & imaging results that were available during my care of the patient were reviewed by me and considered in my medical decision making (see chart for details).     Discussed allergic reactions to insect bites.  This should not translate any additional allergy symptoms or anaphylaxis.  Discussed prevention with insect repellent.  Treatment with cortisone cream and Benadryl at time of bite.  Currently with the degree of discomfort and swelling I would give her some oral prednisone.  Expect improvement in 12 to 24 hours but resolution for probably take a couple of weeks. Final Clinical Impressions(s) / UC Diagnoses   Final diagnoses:  Allergic reaction to insect bite     Discharge Instructions     Ice Take the  prednisone as directed Take the atarax for itching Return as needed   ED Prescriptions    Medication Sig Dispense Auth. Provider   hydrOXYzine (ATARAX/VISTARIL) 25 MG tablet Take 1 tablet (25 mg total) by mouth every 6 (six) hours. 12 tablet Raylene Everts, MD   predniSONE (DELTASONE) 20 MG tablet Take 1 tablet (20 mg total) by mouth 2 (two) times daily with a meal. 10 tablet Raylene Everts, MD     Controlled Substance Prescriptions [place] Controlled Substance Registry consulted? Not Applicable   Raylene Everts, MD 01/22/18 2114

## 2018-02-16 ENCOUNTER — Ambulatory Visit (INDEPENDENT_AMBULATORY_CARE_PROVIDER_SITE_OTHER): Payer: BLUE CROSS/BLUE SHIELD | Admitting: Orthopaedic Surgery

## 2018-03-31 DIAGNOSIS — J029 Acute pharyngitis, unspecified: Secondary | ICD-10-CM | POA: Diagnosis not present

## 2018-04-01 DIAGNOSIS — L959 Vasculitis limited to the skin, unspecified: Secondary | ICD-10-CM | POA: Diagnosis not present

## 2018-05-14 ENCOUNTER — Encounter: Payer: Self-pay | Admitting: Cardiovascular Disease

## 2018-05-14 DIAGNOSIS — I10 Essential (primary) hypertension: Secondary | ICD-10-CM | POA: Diagnosis not present

## 2018-05-14 DIAGNOSIS — R079 Chest pain, unspecified: Secondary | ICD-10-CM | POA: Diagnosis not present

## 2018-05-16 ENCOUNTER — Encounter: Payer: Self-pay | Admitting: Cardiovascular Disease

## 2018-05-17 ENCOUNTER — Encounter: Payer: Self-pay | Admitting: Cardiovascular Disease

## 2018-05-17 DIAGNOSIS — R079 Chest pain, unspecified: Secondary | ICD-10-CM | POA: Diagnosis not present

## 2018-06-01 ENCOUNTER — Other Ambulatory Visit: Payer: Self-pay | Admitting: Internal Medicine

## 2018-06-01 DIAGNOSIS — Z1231 Encounter for screening mammogram for malignant neoplasm of breast: Secondary | ICD-10-CM

## 2018-06-07 ENCOUNTER — Ambulatory Visit
Admission: RE | Admit: 2018-06-07 | Discharge: 2018-06-07 | Disposition: A | Discharge status: Home / Self Care | Payer: BLUE CROSS/BLUE SHIELD | Source: Ambulatory Visit | Attending: Internal Medicine | Admitting: Internal Medicine

## 2018-06-07 DIAGNOSIS — Z1231 Encounter for screening mammogram for malignant neoplasm of breast: Secondary | ICD-10-CM | POA: Diagnosis not present

## 2018-06-07 DIAGNOSIS — Z23 Encounter for immunization: Secondary | ICD-10-CM | POA: Diagnosis not present

## 2018-06-08 ENCOUNTER — Other Ambulatory Visit: Payer: Self-pay | Admitting: Internal Medicine

## 2018-06-08 DIAGNOSIS — R928 Other abnormal and inconclusive findings on diagnostic imaging of breast: Secondary | ICD-10-CM

## 2018-06-08 HISTORY — PX: BREAST BIOPSY: SHX20

## 2018-06-10 ENCOUNTER — Ambulatory Visit
Admission: RE | Admit: 2018-06-10 | Discharge: 2018-06-10 | Disposition: A | Discharge status: Home / Self Care | Payer: BLUE CROSS/BLUE SHIELD | Source: Ambulatory Visit | Attending: Internal Medicine | Admitting: Internal Medicine

## 2018-06-10 ENCOUNTER — Other Ambulatory Visit: Payer: Self-pay | Admitting: Internal Medicine

## 2018-06-10 DIAGNOSIS — R928 Other abnormal and inconclusive findings on diagnostic imaging of breast: Secondary | ICD-10-CM

## 2018-06-10 DIAGNOSIS — N6489 Other specified disorders of breast: Secondary | ICD-10-CM | POA: Diagnosis not present

## 2018-06-10 DIAGNOSIS — N6320 Unspecified lump in the left breast, unspecified quadrant: Secondary | ICD-10-CM

## 2018-06-11 ENCOUNTER — Other Ambulatory Visit: Payer: BLUE CROSS/BLUE SHIELD

## 2018-06-16 ENCOUNTER — Ambulatory Visit (INDEPENDENT_AMBULATORY_CARE_PROVIDER_SITE_OTHER): Payer: BLUE CROSS/BLUE SHIELD | Admitting: Cardiovascular Disease

## 2018-06-16 ENCOUNTER — Encounter: Payer: Self-pay | Admitting: Cardiovascular Disease

## 2018-06-16 ENCOUNTER — Ambulatory Visit: Payer: BLUE CROSS/BLUE SHIELD | Admitting: Cardiovascular Disease

## 2018-06-16 DIAGNOSIS — R0789 Other chest pain: Secondary | ICD-10-CM | POA: Diagnosis not present

## 2018-06-16 DIAGNOSIS — I10 Essential (primary) hypertension: Secondary | ICD-10-CM | POA: Diagnosis not present

## 2018-06-16 NOTE — Assessment & Plan Note (Signed)
History of essential hypertension her blood pressure measured today at 132/78.  She is on diltiazem and losartan as well as Dyazide and metoprolol.  Continue current meds at current dosing.

## 2018-06-16 NOTE — Assessment & Plan Note (Signed)
Ms. Victoria Craig was self-referred because of atypical chest pain.  Her husband [name] is also a patient of mine.  History of treated hypertension as well as family history of heart disease.  She did have a exercise Myoview stress test performed 12/01/2012 which was essentially normal.  Was performed for similar symptoms.  Her chest pain began several months ago and occurs on a daily basis with radiation to her left upper extremity and neck.  I am going to repeat an exercise Myoview stress test to rule out an ischemic etiology.  Her pain has some atypical and some typical features.

## 2018-06-16 NOTE — Progress Notes (Signed)
06/16/2018 [name]   1956-06-07  [id number]  Primary Physician Rogers Blocker, MD Primary Cardiologist: Lorretta Harp MD [name], [place]  HPI:  Victoria Craig is a 62 y.o. moderately overweight married African-American female mother of 2, grandmother of 6 grandchildren who is self-referred for chest pain because her husband Victoria Craig is also a patient of mine.  Her primary care provider is Dr. Kevan Ny.  I apparently saw her in the past and performed a Myoview stress testing 12/01/2012 which was entirely normal.  Risk factors include treated hypertension and family history with both mother and father who had bypass surgery.  She is never had a heart attack or stroke.  She is out on disability because of "weak arms".  She is developed chest pain for several months with left upper extremity and neck radiation occurring on a daily basis.   Current Meds  Medication Sig  . Cholecalciferol (VITAMIN D) 2000 units CAPS Take 2,000 Units by mouth daily.  Marland Kitchen diltiazem (CARTIA XT) 120 MG 24 hr capsule Take 120 mg by mouth daily.  Marland Kitchen doxycycline (VIBRA-TABS) 100 MG tablet   . estradiol (VIVELLE-DOT) 0.1 MG/24HR Place 1 patch onto the skin 2 (two) times a week. New patch on Thursday and Monday  . hydrOXYzine (ATARAX/VISTARIL) 25 MG tablet Take 1 tablet (25 mg total) by mouth every 6 (six) hours.  . influenza vac recombinant HA trivalent (FLUBLOK) injection Inject 0.5 mLs into the muscle once.  Marland Kitchen LORazepam (ATIVAN) 0.5 MG tablet TAKE 1 TABLET TWICE DAILY AS NEEDED FOR ANXIETY  . losartan (COZAAR) 50 MG tablet Take 50 mg by mouth daily.  . metoprolol succinate (TOPROL-XL) 25 MG 24 hr tablet Take 25 mg by mouth daily.  Marland Kitchen omeprazole (PRILOSEC) 20 MG capsule TAKE 1 CAPSULE BY MOUTH TWICE A DAY BEFORE A MEAL (PLEASE MAKE APPOINTMENT)  . triamterene-hydrochlorothiazide (DYAZIDE) 37.5-25 MG per capsule Take 1 capsule by mouth daily.   Marland Kitchen zolpidem (AMBIEN) 5 MG tablet Take 5 mg by mouth  at bedtime as needed. For sleep     No Known Allergies  Social History   Socioeconomic History  . Marital status: Married    Spouse name: Not on file  . Number of children: Not on file  . Years of education: Not on file  . Highest education level: Not on file  Occupational History  . Not on file  Social Needs  . Financial resource strain: Not on file  . Food insecurity:    Worry: Not on file    Inability: Not on file  . Transportation needs:    Medical: Not on file    Non-medical: Not on file  Tobacco Use  . Smoking status: Never Smoker  . Smokeless tobacco: Never Used  Substance and Sexual Activity  . Alcohol use: Yes    Comment: Occ glass of wine  . Drug use: No  . Sexual activity: Not on file  Lifestyle  . Physical activity:    Days per week: Not on file    Minutes per session: Not on file  . Stress: Not on file  Relationships  . Social connections:    Talks on phone: Not on file    Gets together: Not on file    Attends religious service: Not on file    Active member of club or organization: Not on file    Attends meetings of clubs or organizations: Not on file    Relationship status: Not  on file  . Intimate partner violence:    Fear of current or ex partner: Not on file    Emotionally abused: Not on file    Physically abused: Not on file    Forced sexual activity: Not on file  Other Topics Concern  . Not on file  Social History Narrative  . Not on file     Review of Systems: General: negative for chills, fever, night sweats or weight changes.  Cardiovascular: negative for chest pain, dyspnea on exertion, edema, orthopnea, palpitations, paroxysmal nocturnal dyspnea or shortness of breath Dermatological: negative for rash Respiratory: negative for cough or wheezing Urologic: negative for hematuria Abdominal: negative for nausea, vomiting, diarrhea, bright red blood per rectum, melena, or hematemesis Neurologic: negative for visual changes, syncope, or  dizziness All other systems reviewed and are otherwise negative except as noted above.    Blood pressure 132/78, pulse 79, height 4\' 11"  (1.499 m), weight 208 lb (94.3 kg).  General appearance: alert and no distress Neck: no adenopathy, no carotid bruit, no JVD, supple, symmetrical, trachea midline and thyroid not enlarged, symmetric, no tenderness/mass/nodules Lungs: clear to auscultation bilaterally Heart: regular rate and rhythm, S1, S2 normal, no murmur, click, rub or gallop Extremities: extremities normal, atraumatic, no cyanosis or edema Pulses: 2+ and symmetric Skin: Skin color, texture, turgor normal. No rashes or lesions Neurologic: Alert and oriented X 3, normal strength and tone. Normal symmetric reflexes. Normal coordination and gait  EKG sinus rhythm at 79 with incomplete right bundle branch block.  I personally reviewed this EKG.  ASSESSMENT AND PLAN:   Atypical chest pain Ms. Wall was self-referred because of atypical chest pain.  Her husband Victoria Craig is also a patient of mine.  History of treated hypertension as well as family history of heart disease.  She did have a exercise Myoview stress test performed 12/01/2012 which was essentially normal.  Was performed for similar symptoms.  Her chest pain began several months ago and occurs on a daily basis with radiation to her left upper extremity and neck.  I am going to repeat an exercise Myoview stress test to rule out an ischemic etiology.  Her pain has some atypical and some typical features.  Essential hypertension History of essential hypertension her blood pressure measured today at 132/78.  She is on diltiazem and losartan as well as Dyazide and metoprolol.  Continue current meds at current dosing.      Lorretta Harp MD FACP,FACC,FAHA, [facility] 06/16/2018 10:22 AM

## 2018-06-16 NOTE — Patient Instructions (Addendum)
Medication Instructions:  Your physician recommends that you continue on your current medications as directed. Please refer to the Current Medication list given to you today. If you need a refill on your cardiac medications before your next appointment, please call your pharmacy.   Lab work: none If you have labs (blood work) drawn today and your tests are completely normal, you will receive your results only by: Marland Kitchen MyChart Message (if you have MyChart) OR . A paper copy in the mail If you have any lab test that is abnormal or we need to change your treatment, we will call you to review the results.  Testing/Procedures: Your physician has requested that you have a lexiscan myoview. For further information please visit [email or web address]. Please follow instruction sheet, as given.   Follow-Up: At [facility], you and your health needs are our priority.  As part of our continuing mission to provide you with exceptional heart care, we have created designated Provider Care Teams.  These Care Teams include your primary Cardiologist (physician) and Advanced Practice Providers (APPs -  Physician Assistants and Nurse Practitioners) who all work together to provide you with the care you need, when you need it. Follow up with Dr. Gwenlyn Found as needed.   Any Other Special Instructions Will Be Listed Below (If Applicable).

## 2018-06-16 NOTE — Addendum Note (Signed)
Addended by: Newt Minion on: 06/16/2018 10:30 AM   Modules accepted: Orders

## 2018-06-17 ENCOUNTER — Ambulatory Visit
Admission: RE | Admit: 2018-06-17 | Discharge: 2018-06-17 | Disposition: A | Discharge status: Home / Self Care | Payer: BLUE CROSS/BLUE SHIELD | Source: Ambulatory Visit | Attending: Internal Medicine | Admitting: Internal Medicine

## 2018-06-17 DIAGNOSIS — N6320 Unspecified lump in the left breast, unspecified quadrant: Secondary | ICD-10-CM

## 2018-06-17 DIAGNOSIS — N6012 Diffuse cystic mastopathy of left breast: Secondary | ICD-10-CM | POA: Diagnosis not present

## 2018-06-17 DIAGNOSIS — N6321 Unspecified lump in the left breast, upper outer quadrant: Secondary | ICD-10-CM | POA: Diagnosis not present

## 2018-06-22 ENCOUNTER — Ambulatory Visit (HOSPITAL_COMMUNITY)
Admission: RE | Admit: 2018-06-22 | Discharge: 2018-06-22 | Disposition: A | Discharge status: Home / Self Care | Payer: BLUE CROSS/BLUE SHIELD | Source: Ambulatory Visit | Attending: Cardiovascular Disease | Admitting: Cardiovascular Disease

## 2018-06-22 DIAGNOSIS — R0789 Other chest pain: Secondary | ICD-10-CM | POA: Diagnosis not present

## 2018-06-22 LAB — MYOCARDIAL PERFUSION IMAGING
CHL CUP RESTING HR STRESS: 81 {beats}/min
CSEPPHR: 111 {beats}/min
LV sys vol: 17 mL
LVDIAVOL: 66 mL (ref 46–106)
SDS: 0
SRS: 0
SSS: 0
TID: 1.15

## 2018-06-22 MED ORDER — REGADENOSON 0.4 MG/5ML IV SOLN
0.4000 mg | Freq: Once | INTRAVENOUS | Status: AC
  Administered 2018-06-22: 0.4 mg via INTRAVENOUS

## 2018-06-22 MED ORDER — TECHNETIUM TC 99M TETROFOSMIN IV KIT
31.0000 | PACK | Freq: Once | INTRAVENOUS | Status: AC | PRN
  Administered 2018-06-22: 31 via INTRAVENOUS
  Filled 2018-06-22: qty 31

## 2018-06-22 MED ORDER — TECHNETIUM TC 99M TETROFOSMIN IV KIT
9.4000 | PACK | Freq: Once | INTRAVENOUS | Status: AC | PRN
  Administered 2018-06-22: 9.4 via INTRAVENOUS
  Filled 2018-06-22: qty 10

## 2018-06-24 ENCOUNTER — Inpatient Hospital Stay (HOSPITAL_COMMUNITY): Admission: RE | Admit: 2018-06-24 | Payer: BLUE CROSS/BLUE SHIELD | Source: Ambulatory Visit

## 2018-07-08 DIAGNOSIS — I10 Essential (primary) hypertension: Secondary | ICD-10-CM | POA: Diagnosis not present

## 2018-07-08 DIAGNOSIS — F411 Generalized anxiety disorder: Secondary | ICD-10-CM | POA: Diagnosis not present

## 2018-07-08 DIAGNOSIS — E669 Obesity, unspecified: Secondary | ICD-10-CM | POA: Diagnosis not present

## 2018-07-08 DIAGNOSIS — R0789 Other chest pain: Secondary | ICD-10-CM | POA: Diagnosis not present

## 2018-10-13 ENCOUNTER — Other Ambulatory Visit: Payer: Self-pay | Admitting: Internal Medicine

## 2018-10-13 DIAGNOSIS — R51 Headache: Secondary | ICD-10-CM | POA: Diagnosis not present

## 2018-10-13 DIAGNOSIS — I10 Essential (primary) hypertension: Secondary | ICD-10-CM | POA: Diagnosis not present

## 2018-10-13 DIAGNOSIS — R2990 Unspecified symptoms and signs involving the nervous system: Secondary | ICD-10-CM | POA: Diagnosis not present

## 2018-10-13 DIAGNOSIS — G8929 Other chronic pain: Secondary | ICD-10-CM

## 2018-10-13 DIAGNOSIS — K120 Recurrent oral aphthae: Secondary | ICD-10-CM | POA: Diagnosis not present

## 2018-10-16 ENCOUNTER — Other Ambulatory Visit: Payer: BLUE CROSS/BLUE SHIELD

## 2018-10-20 ENCOUNTER — Encounter: Payer: Self-pay | Admitting: Neurology

## 2018-10-20 DIAGNOSIS — R51 Headache: Secondary | ICD-10-CM | POA: Diagnosis not present

## 2018-10-20 DIAGNOSIS — I10 Essential (primary) hypertension: Secondary | ICD-10-CM | POA: Diagnosis not present

## 2018-10-20 DIAGNOSIS — E230 Hypopituitarism: Secondary | ICD-10-CM | POA: Diagnosis not present

## 2018-10-20 DIAGNOSIS — G44309 Post-traumatic headache, unspecified, not intractable: Secondary | ICD-10-CM | POA: Diagnosis not present

## 2018-11-01 DIAGNOSIS — H25013 Cortical age-related cataract, bilateral: Secondary | ICD-10-CM | POA: Diagnosis not present

## 2018-11-01 DIAGNOSIS — H40013 Open angle with borderline findings, low risk, bilateral: Secondary | ICD-10-CM | POA: Diagnosis not present

## 2018-11-01 DIAGNOSIS — H353131 Nonexudative age-related macular degeneration, bilateral, early dry stage: Secondary | ICD-10-CM | POA: Diagnosis not present

## 2018-11-01 DIAGNOSIS — H5711 Ocular pain, right eye: Secondary | ICD-10-CM | POA: Diagnosis not present

## 2018-11-03 DIAGNOSIS — M792 Neuralgia and neuritis, unspecified: Secondary | ICD-10-CM | POA: Diagnosis not present

## 2018-12-30 ENCOUNTER — Ambulatory Visit: Payer: BLUE CROSS/BLUE SHIELD | Admitting: Neurology

## 2019-01-01 DIAGNOSIS — M792 Neuralgia and neuritis, unspecified: Secondary | ICD-10-CM | POA: Diagnosis not present

## 2019-04-15 ENCOUNTER — Other Ambulatory Visit: Payer: Self-pay | Admitting: Internal Medicine

## 2019-04-15 DIAGNOSIS — N644 Mastodynia: Secondary | ICD-10-CM

## 2019-04-21 ENCOUNTER — Ambulatory Visit
Admission: RE | Admit: 2019-04-21 | Discharge: 2019-04-21 | Disposition: A | Discharge status: Home / Self Care | Payer: BC Managed Care – PPO | Source: Ambulatory Visit | Attending: Internal Medicine | Admitting: Internal Medicine

## 2019-04-21 ENCOUNTER — Other Ambulatory Visit: Payer: Self-pay

## 2019-04-21 ENCOUNTER — Ambulatory Visit: Payer: BLUE CROSS/BLUE SHIELD

## 2019-04-21 DIAGNOSIS — I10 Essential (primary) hypertension: Secondary | ICD-10-CM | POA: Diagnosis not present

## 2019-04-21 DIAGNOSIS — N6042 Mammary duct ectasia of left breast: Secondary | ICD-10-CM | POA: Diagnosis not present

## 2019-04-21 DIAGNOSIS — G44309 Post-traumatic headache, unspecified, not intractable: Secondary | ICD-10-CM | POA: Diagnosis not present

## 2019-04-21 DIAGNOSIS — Z733 Stress, not elsewhere classified: Secondary | ICD-10-CM | POA: Diagnosis not present

## 2019-04-21 DIAGNOSIS — N644 Mastodynia: Secondary | ICD-10-CM

## 2019-04-21 DIAGNOSIS — R922 Inconclusive mammogram: Secondary | ICD-10-CM | POA: Diagnosis not present

## 2019-05-09 ENCOUNTER — Other Ambulatory Visit: Payer: Self-pay | Admitting: Internal Medicine

## 2019-05-09 DIAGNOSIS — Z1231 Encounter for screening mammogram for malignant neoplasm of breast: Secondary | ICD-10-CM

## 2019-05-10 DIAGNOSIS — E7800 Pure hypercholesterolemia, unspecified: Secondary | ICD-10-CM | POA: Diagnosis not present

## 2019-05-10 DIAGNOSIS — Z130 Encounter for screening for diseases of the blood and blood-forming organs and certain disorders involving the immune mechanism: Secondary | ICD-10-CM | POA: Diagnosis not present

## 2019-05-10 DIAGNOSIS — I10 Essential (primary) hypertension: Secondary | ICD-10-CM | POA: Diagnosis not present

## 2019-05-10 DIAGNOSIS — F329 Major depressive disorder, single episode, unspecified: Secondary | ICD-10-CM | POA: Diagnosis not present

## 2019-06-02 DIAGNOSIS — H40033 Anatomical narrow angle, bilateral: Secondary | ICD-10-CM | POA: Diagnosis not present

## 2019-06-02 DIAGNOSIS — H40013 Open angle with borderline findings, low risk, bilateral: Secondary | ICD-10-CM | POA: Diagnosis not present

## 2019-06-02 DIAGNOSIS — G500 Trigeminal neuralgia: Secondary | ICD-10-CM | POA: Diagnosis not present

## 2019-06-02 DIAGNOSIS — H04123 Dry eye syndrome of bilateral lacrimal glands: Secondary | ICD-10-CM | POA: Diagnosis not present

## 2019-06-07 DIAGNOSIS — M792 Neuralgia and neuritis, unspecified: Secondary | ICD-10-CM | POA: Diagnosis not present

## 2019-06-21 ENCOUNTER — Encounter: Payer: Self-pay | Admitting: Cardiovascular Disease

## 2019-06-21 ENCOUNTER — Other Ambulatory Visit: Payer: Self-pay

## 2019-06-21 ENCOUNTER — Ambulatory Visit (INDEPENDENT_AMBULATORY_CARE_PROVIDER_SITE_OTHER): Payer: BC Managed Care – PPO | Admitting: Cardiovascular Disease

## 2019-06-21 ENCOUNTER — Ambulatory Visit
Admission: RE | Admit: 2019-06-21 | Discharge: 2019-06-21 | Disposition: A | Discharge status: Home / Self Care | Payer: BC Managed Care – PPO | Source: Ambulatory Visit | Attending: Internal Medicine | Admitting: Internal Medicine

## 2019-06-21 DIAGNOSIS — R0789 Other chest pain: Secondary | ICD-10-CM

## 2019-06-21 DIAGNOSIS — I10 Essential (primary) hypertension: Secondary | ICD-10-CM | POA: Diagnosis not present

## 2019-06-21 DIAGNOSIS — Z1231 Encounter for screening mammogram for malignant neoplasm of breast: Secondary | ICD-10-CM

## 2019-06-21 NOTE — Progress Notes (Signed)
06/21/2019 Victoria Craig   1956-05-11  [id number]  Primary Physician Victoria Blocker, MD Primary Cardiologist: Lorretta Harp MD Victoria Craig, [place]  HPI:  Victoria Craig is a 63 y.o.  moderately overweight married African-American female mother of 2, grandmother of 6 grandchildren who is self-referred for chest pain because her husband Victoria Craig is also a patient of mine.  Her primary care provider is Dr. Kevan Craig.  I last saw her in the office 06/16/2018. I apparently saw her in the past and performed a Myoview stress testing 12/01/2012 which was entirely normal.  Risk factors include treated hypertension and family history with both mother and father who had bypass surgery.  She is never had a heart attack or stroke.  She is out on disability because of "weak arms".  She is developed chest pain for several months with left upper extremity and neck radiation occurring on a daily basis.  Since I last saw her she is remained stable.  She did have a negative Myoview 06/22/2018.  She believes her upper abdominal/lower chest pain probably is reflux in nature since it gets better with antacids.   Current Meds  Medication Sig  . Cholecalciferol (VITAMIN D) 2000 units CAPS Take 2,000 Units by mouth daily.  Marland Kitchen diltiazem (CARTIA XT) 120 MG 24 hr capsule Take 120 mg by mouth daily.  Marland Kitchen estradiol (VIVELLE-DOT) 0.1 MG/24HR Place 1 patch onto the skin 2 (two) times a week. New patch on Thursday and Monday  . LORazepam (ATIVAN) 0.5 MG tablet TAKE 1 TABLET TWICE DAILY AS NEEDED FOR ANXIETY  . losartan (COZAAR) 50 MG tablet Take 50 mg by mouth daily.  . metoprolol succinate (TOPROL-XL) 25 MG 24 hr tablet Take 25 mg by mouth daily.  Marland Kitchen omeprazole (PRILOSEC) 20 MG capsule TAKE 1 CAPSULE BY MOUTH TWICE A DAY BEFORE A MEAL (PLEASE MAKE APPOINTMENT)  . triamterene-hydrochlorothiazide (DYAZIDE) 37.5-25 MG per capsule Take 1 capsule by mouth daily.      No Known Allergies  Social History    Socioeconomic History  . Marital status: Married    Spouse name: Not on file  . Number of children: Not on file  . Years of education: Not on file  . Highest education level: Not on file  Occupational History  . Not on file  Social Needs  . Financial resource strain: Not on file  . Food insecurity    Worry: Not on file    Inability: Not on file  . Transportation needs    Medical: Not on file    Non-medical: Not on file  Tobacco Use  . Smoking status: Never Smoker  . Smokeless tobacco: Never Used  Substance and Sexual Activity  . Alcohol use: Yes    Comment: Occ glass of wine  . Drug use: No  . Sexual activity: Not on file  Lifestyle  . Physical activity    Days per week: Not on file    Minutes per session: Not on file  . Stress: Not on file  Relationships  . Social Herbalist on phone: Not on file    Gets together: Not on file    Attends religious service: Not on file    Active member of club or organization: Not on file    Attends meetings of clubs or organizations: Not on file    Relationship status: Not on file  . Intimate partner violence    Fear of current or ex  partner: Not on file    Emotionally abused: Not on file    Physically abused: Not on file    Forced sexual activity: Not on file  Other Topics Concern  . Not on file  Social History Narrative  . Not on file     Review of Systems: General: negative for chills, fever, night sweats or weight changes.  Cardiovascular: negative for chest pain, dyspnea on exertion, edema, orthopnea, palpitations, paroxysmal nocturnal dyspnea or shortness of breath Dermatological: negative for rash Respiratory: negative for cough or wheezing Urologic: negative for hematuria Abdominal: negative for nausea, vomiting, diarrhea, bright red blood per rectum, melena, or hematemesis Neurologic: negative for visual changes, syncope, or dizziness All other systems reviewed and are otherwise negative except as noted  above.    Blood pressure 118/60, pulse 80, height 5' (1.524 m), weight 203 lb (92.1 kg), SpO2 99 %.  General appearance: alert and no distress Neck: no adenopathy, no carotid bruit, no JVD, supple, symmetrical, trachea midline and thyroid not enlarged, symmetric, no tenderness/mass/nodules Lungs: clear to auscultation bilaterally Heart: regular rate and rhythm, S1, S2 normal, no murmur, click, rub or gallop Extremities: extremities normal, atraumatic, no cyanosis or edema Pulses: 2+ and symmetric Skin: Skin color, texture, turgor normal. No rashes or lesions Neurologic: Alert and oriented X 3, normal strength and tone. Normal symmetric reflexes. Normal coordination and gait  EKG sinus rhythm at 80 with RSR prime in leads V1 and V2 consistent with RV conduction delay.  I personally reviewed this EKG.  ASSESSMENT AND PLAN:   Atypical chest pain History of atypical chest pain with negative Myoview stress test performed 06/22/2018.  She currently feels that her pain is reflux in nature since he gets better with antacids.  Essential hypertension Patient presents hypertension with blood pressure measured today at 118/60.  She is on diltiazem, losartan , metoprolol, and Dyazide.      Lorretta Harp MD FACP,FACC,FAHA, [facility] 06/21/2019 9:30 AM

## 2019-06-21 NOTE — Assessment & Plan Note (Addendum)
Patient presents hypertension with blood pressure measured today at 118/60.  She is on diltiazem, losartan , metoprolol, and Dyazide.

## 2019-06-21 NOTE — Patient Instructions (Signed)
Medication Instructions:  Your physician recommends that you continue on your current medications as directed. Please refer to the Current Medication list given to you today.  If you need a refill on your cardiac medications before your next appointment, please call your pharmacy.   Lab work: none If you have labs (blood work) drawn today and your tests are completely normal, you will receive your results only by: Marland Kitchen MyChart Message (if you have MyChart) OR . A paper copy in the mail If you have any lab test that is abnormal or we need to change your treatment, we will call you to review the results.  Testing/Procedures: none  Follow-Up: At [facility], you and your health needs are our priority.  As part of our continuing mission to provide you with exceptional heart care, we have created designated Provider Care Teams.  These Care Teams include your primary Cardiologist (physician) and Advanced Practice Providers (APPs -  Physician Assistants and Nurse Practitioners) who all work together to provide you with the care you need, when you need it. . You may schedule a follow up appointment as needed.  You may see Dr. Gwenlyn Found or one of the following Advanced Practice Providers on your designated Care Team:   . Kerin Ransom, PA-C . Daleen Snook Kroeger, PA-C . Sande Rives, PA-C ____________________ . Almyra Deforest, PA-C . Fabian Sharp, PA-C . Jory Sims, DNP . Rosaria Ferries, PA-C

## 2019-06-21 NOTE — Assessment & Plan Note (Signed)
History of atypical chest pain with negative Myoview stress test performed 06/22/2018.  She currently feels that her pain is reflux in nature since he gets better with antacids.

## 2019-07-04 DIAGNOSIS — L989 Disorder of the skin and subcutaneous tissue, unspecified: Secondary | ICD-10-CM | POA: Diagnosis not present

## 2019-07-04 DIAGNOSIS — B999 Unspecified infectious disease: Secondary | ICD-10-CM | POA: Diagnosis not present

## 2019-07-04 DIAGNOSIS — I10 Essential (primary) hypertension: Secondary | ICD-10-CM | POA: Diagnosis not present

## 2019-07-04 DIAGNOSIS — L259 Unspecified contact dermatitis, unspecified cause: Secondary | ICD-10-CM | POA: Diagnosis not present

## 2019-07-28 ENCOUNTER — Other Ambulatory Visit: Payer: Self-pay | Admitting: Cardiology

## 2019-07-28 DIAGNOSIS — Z20822 Contact with and (suspected) exposure to covid-19: Secondary | ICD-10-CM

## 2019-08-02 LAB — NOVEL CORONAVIRUS, NAA: SARS-CoV-2, NAA: NOT DETECTED

## 2020-08-20 ENCOUNTER — Emergency Department (HOSPITAL_COMMUNITY)
Admission: EM | Admit: 2020-08-20 | Discharge: 2020-08-21 | Disposition: A | Discharge status: Home / Self Care | Payer: BC Managed Care – PPO | Attending: Emergency Medicine | Admitting: Emergency Medicine

## 2020-08-20 ENCOUNTER — Encounter (HOSPITAL_COMMUNITY): Payer: Self-pay | Admitting: Emergency Medicine

## 2020-08-20 ENCOUNTER — Other Ambulatory Visit: Payer: Self-pay

## 2020-08-20 DIAGNOSIS — R319 Hematuria, unspecified: Secondary | ICD-10-CM | POA: Diagnosis present

## 2020-08-20 DIAGNOSIS — R1909 Other intra-abdominal and pelvic swelling, mass and lump: Secondary | ICD-10-CM | POA: Insufficient documentation

## 2020-08-20 DIAGNOSIS — Z79899 Other long term (current) drug therapy: Secondary | ICD-10-CM | POA: Diagnosis not present

## 2020-08-20 DIAGNOSIS — N200 Calculus of kidney: Secondary | ICD-10-CM | POA: Diagnosis not present

## 2020-08-20 DIAGNOSIS — R1030 Lower abdominal pain, unspecified: Secondary | ICD-10-CM

## 2020-08-20 DIAGNOSIS — I10 Essential (primary) hypertension: Secondary | ICD-10-CM | POA: Diagnosis not present

## 2020-08-20 DIAGNOSIS — R1900 Intra-abdominal and pelvic swelling, mass and lump, unspecified site: Secondary | ICD-10-CM

## 2020-08-20 LAB — BASIC METABOLIC PANEL
Anion gap: 11 (ref 5–15)
BUN: 13 mg/dL (ref 8–23)
CO2: 25 mmol/L (ref 22–32)
Calcium: 9.8 mg/dL (ref 8.9–10.3)
Chloride: 104 mmol/L (ref 98–111)
Creatinine, Ser: 0.69 mg/dL (ref 0.44–1.00)
GFR, Estimated: >60 mL/min (ref >60)
Glucose, Bld: 89 mg/dL (ref 70–99)
Potassium: 3.7 mmol/L (ref 3.5–5.1)
Sodium: 140 mmol/L (ref 135–145)

## 2020-08-20 LAB — CBC WITH DIFFERENTIAL/PLATELET
Abs Immature Granulocytes: 0.02 10*3/uL (ref 0.00–0.07)
Basophils Absolute: 0 10*3/uL (ref 0.0–0.1)
Basophils Relative: 1 %
Eosinophils Absolute: 0.1 10*3/uL (ref 0.0–0.5)
Eosinophils Relative: 2 %
HCT: 49.3 % — ABNORMAL HIGH (ref 36.0–46.0)
Hemoglobin: 15.6 g/dL — ABNORMAL HIGH (ref 12.0–15.0)
Immature Granulocytes: 0 %
Lymphocytes Relative: 44 %
Lymphs Abs: 2.7 10*3/uL (ref 0.7–4.0)
MCH: 27 pg (ref 26.0–34.0)
MCHC: 31.6 g/dL (ref 30.0–36.0)
MCV: 85.3 fL (ref 80.0–100.0)
Monocytes Absolute: 0.5 10*3/uL (ref 0.1–1.0)
Monocytes Relative: 9 %
Neutro Abs: 2.7 10*3/uL (ref 1.7–7.7)
Neutrophils Relative %: 44 %
Platelets: 213 10*3/uL (ref 150–400)
RBC: 5.78 MIL/uL — ABNORMAL HIGH (ref 3.87–5.11)
RDW: 14.1 % (ref 11.5–15.5)
WBC: 6.1 10*3/uL (ref 4.0–10.5)
nRBC: 0 % (ref 0.0–0.2)

## 2020-08-20 LAB — URINALYSIS, ROUTINE W REFLEX MICROSCOPIC
Bilirubin Urine: NEGATIVE
Glucose, UA: NEGATIVE mg/dL
Ketones, ur: NEGATIVE mg/dL
Nitrite: NEGATIVE
Protein, ur: NEGATIVE mg/dL
Specific Gravity, Urine: 1.008 (ref 1.005–1.030)
pH: 5 (ref 5.0–8.0)

## 2020-08-20 MED ORDER — OXYCODONE-ACETAMINOPHEN 5-325 MG PO TABS
1.0000 | ORAL_TABLET | Freq: Once | ORAL | Status: AC
  Administered 2020-08-20: 1 via ORAL
  Filled 2020-08-20: qty 1

## 2020-08-20 NOTE — ED Triage Notes (Signed)
Patient advised by her urologist ( Dr. Marlou Sa)  to go to ER due to hematuria and hypogastric pain onset today .

## 2020-08-21 ENCOUNTER — Emergency Department (HOSPITAL_COMMUNITY): Payer: BC Managed Care – PPO

## 2020-08-21 ENCOUNTER — Telehealth (HOSPITAL_COMMUNITY): Payer: Self-pay | Admitting: Emergency Medicine

## 2020-08-21 MED ORDER — HYDROCODONE-ACETAMINOPHEN 5-325 MG PO TABS: 2.0000 | ORAL_TABLET | Freq: Four times a day (QID) | ORAL | 0 refills | Status: DC | PRN

## 2020-08-21 MED ORDER — KETOROLAC TROMETHAMINE 60 MG/2ML IM SOLN
60.0000 mg | Freq: Once | INTRAMUSCULAR | Status: AC
  Administered 2020-08-21: 60 mg via INTRAMUSCULAR
  Filled 2020-08-21: qty 2

## 2020-08-21 MED ORDER — ONDANSETRON 4 MG PO TBDP: ORAL_TABLET | ORAL | 0 refills | Status: DC

## 2020-08-21 MED ORDER — HYDROCODONE-ACETAMINOPHEN 5-325 MG PO TABS: 2.0000 | ORAL_TABLET | ORAL | 0 refills | Status: DC | PRN

## 2020-08-21 MED ORDER — CEPHALEXIN 500 MG PO CAPS: 500.0000 mg | ORAL_CAPSULE | Freq: Two times a day (BID) | ORAL | 0 refills | Status: DC

## 2020-08-21 MED ORDER — OXYCODONE-ACETAMINOPHEN 5-325 MG PO TABS
1.0000 | ORAL_TABLET | Freq: Once | ORAL | Status: AC
  Administered 2020-08-21: 1 via ORAL
  Filled 2020-08-21: qty 1

## 2020-08-21 NOTE — Discharge Instructions (Addendum)
Follow up closely with urology for your kidney stone. Follow-up closely with gynecology for the cystic structure. Return for uncontrolled pain, fevers, persistent vomiting or new concerns. Use ibuprofen every 6 hrs for pain.  For severe pain take norco or vicodin however realize they have the potential for addiction and it can make you sleepy and has tylenol in it.  No operating machinery while taking.

## 2020-08-21 NOTE — ED Provider Notes (Signed)
[place] EMERGENCY DEPARTMENT Provider Note   CSN: [id number] Arrival date & time: 08/20/20  1811     History Chief Complaint  Patient presents with  . Hematuria    Victoria Craig is a 64 y.o. female.  Patient with history of reflux, cholesterol, high blood pressure presents with hematuria and lower abdominal pain worse suprapubic and left lower quadrant since yesterday.  No history of similar.  No history of kidney stones or significant urine infections.  No burning with urination.  No blood thinner use.  Patient was sent over from primary care doctor Dr. Marlou Sa for further evaluation.        Past Medical History:  Diagnosis Date  . Acute maxillary sinusitis   . Aphthous ulcer   . Arthritis   . Depression   . Essential hypertension, malignant   . GERD (gastroesophageal reflux disease)   . Lateral meniscal tear    right knee  . Mitral valve prolapse    normal ECHO 12-01-12, EF 60%  . Obesity, unspecified   . Pure hypercholesterolemia   . Sleep disturbance     Patient Active Problem List   Diagnosis Date Noted  . Atypical chest pain 06/16/2018  . Essential hypertension 06/16/2018  . Acute meniscal tear of right knee 04/06/2015  . Trigger finger, right 05/08/2014  . Rupture of left biceps tendon 04/15/2013    Past Surgical History:  Procedure Laterality Date  . ABDOMINAL HYSTERECTOMY  1986  . BREAST BIOPSY Left 06/2018  . CESAREAN SECTION  77,81  . CHOLECYSTECTOMY  2011  . COLONOSCOPY    . DISTAL BICEPS TENDON REPAIR Right 04/15/2013   Procedure: DISTAL BICEPS TENDON REPAIR;  Surgeon: Marybelle Killings, MD;  Location: [facility];  Service: Orthopedics;  Laterality: Right;  Right Distal Biceps Tendon Repair  . H/O Event monitor  2014   Palpitations  . KNEE ARTHROSCOPY Right 04/06/2015   Procedure: Right Knee Arthroscopy, Partial Lateral Menisectomy, Debridement Parameniscal Cyst;  Surgeon: Marybelle Killings, MD;  Location: [facility];  Service: Orthopedics;  Laterality: Right;  . KNEE ARTHROSCOPY WITH LATERAL MENISECTOMY Right 04/06/2015   Procedure: KNEE ARTHROSCOPY WITH PARTIAL LATERAL MENISECTOMY;  Surgeon: Marybelle Killings, MD;  Location: [facility];  Service: Orthopedics;  Laterality: Right;  . TRIGGER FINGER RELEASE Right 05/08/2014   Procedure: Excision Right Palm Mass, Trigger Finger Release Right Ring Finger;  Surgeon: Marybelle Killings, MD;  Location: [facility];  Service: Orthopedics;  Laterality: Right;  . TUBAL LIGATION       OB History   No obstetric history on file.     Family History  Problem Relation Age of Onset  . Heart disease Mother   . Heart disease Father   . Hypertension Brother   . Cancer Brother   . Stroke Maternal Grandmother   . Heart disease Maternal Grandfather   . Heart disease Paternal Grandmother   . Breast cancer Neg Hx     Social History   Tobacco Use  . Smoking status: Never Smoker  . Smokeless tobacco: Never Used  Substance Use Topics  . Alcohol use: Yes    Comment: Occ glass of wine  . Drug use: No    Home Medications Prior to Admission medications   Medication Sig Start Date End Date Taking? Authorizing Provider  cephALEXin (KEFLEX) 500 MG capsule Take 1 capsule (500 mg total) by mouth 2 (two) times daily. 08/21/20   Elnora Morrison, MD  Cholecalciferol (VITAMIN D) 2000 units CAPS Take 2,000 Units by mouth daily.    [provider]  diltiazem (CARTIA XT) 120 MG 24 hr capsule Take 120 mg by mouth daily.    [provider]  estradiol (VIVELLE-DOT) 0.1 MG/24HR Place 1 patch onto the skin 2 (two) times a week. New patch on Thursday and Monday    [provider]  HYDROcodone-acetaminophen (NORCO) 5-325 MG tablet Take 2 tablets by mouth every 6 (six) hours as needed. 08/21/20   Elnora Morrison, MD  LORazepam (ATIVAN) 0.5 MG tablet TAKE 1 TABLET TWICE DAILY AS NEEDED FOR ANXIETY 05/02/18   [provider]  losartan  (COZAAR) 50 MG tablet Take 50 mg by mouth daily.    [provider]  metoprolol succinate (TOPROL-XL) 25 MG 24 hr tablet Take 25 mg by mouth daily.    [provider]  omeprazole (PRILOSEC) 20 MG capsule TAKE 1 CAPSULE BY MOUTH TWICE A DAY BEFORE A MEAL (PLEASE MAKE APPOINTMENT) 07/31/14   Lorretta Harp, MD  ondansetron (ZOFRAN ODT) 4 MG disintegrating tablet 4mg  ODT q4 hours prn nausea/vomit 08/21/20   Elnora Morrison, MD  triamterene-hydrochlorothiazide (DYAZIDE) 37.5-25 MG per capsule Take 1 capsule by mouth daily.     [provider]    Allergies    Patient has no known allergies.  Review of Systems   Review of Systems  Constitutional: Negative for chills and fever.  HENT: Negative for congestion.   Eyes: Negative for visual disturbance.  Respiratory: Negative for shortness of breath.   Cardiovascular: Negative for chest pain.  Gastrointestinal: Positive for abdominal pain. Negative for vomiting.  Genitourinary: Positive for hematuria. Negative for dysuria and flank pain.  Musculoskeletal: Negative for back pain, neck pain and neck stiffness.  Skin: Negative for rash.  Neurological: Negative for light-headedness and headaches.    Physical Exam Updated Vital Signs BP (!) 143/86   Pulse 76   Temp 97.6 F (36.4 C) (Oral)   Resp 12   Ht 5' (1.524 m)   Wt 92 kg   SpO2 100%   BMI 39.61 kg/m   Physical Exam Vitals and nursing note reviewed.  Constitutional:      Appearance: She is well-developed and well-nourished.  HENT:     Head: Normocephalic and atraumatic.  Eyes:     General:        Right eye: No discharge.        Left eye: No discharge.     Conjunctiva/sclera: Conjunctivae normal.  Neck:     Trachea: No tracheal deviation.  Cardiovascular:     Rate and Rhythm: Normal rate and regular rhythm.  Pulmonary:     Effort: Pulmonary effort is normal.     Breath sounds: Normal breath sounds.  Abdominal:     General: There is no  distension.     Palpations: Abdomen is soft.     Tenderness: There is abdominal tenderness (suprapubic and LLQ). There is no guarding.  Musculoskeletal:        General: No edema.     Cervical back: Normal range of motion and neck supple.  Skin:    General: Skin is warm.     Findings: No rash.  Neurological:     General: No focal deficit present.     Mental Status: She is alert and oriented to person, place, and time.  Psychiatric:        Mood and Affect: Mood and affect and mood normal.  ED Results / Procedures / Treatments   Labs (all labs ordered are listed, but only abnormal results are displayed) Labs Reviewed  URINALYSIS, ROUTINE W REFLEX MICROSCOPIC - Abnormal; Notable for the following components:      Result Value   APPearance HAZY (*)    Hgb urine dipstick LARGE (*)    Leukocytes,Ua TRACE (*)    Bacteria, UA MANY (*)    All other components within normal limits  CBC WITH DIFFERENTIAL/PLATELET - Abnormal; Notable for the following components:   RBC 5.78 (*)    Hemoglobin 15.6 (*)    HCT 49.3 (*)    All other components within normal limits  URINE CULTURE  BASIC METABOLIC PANEL    EKG None  Radiology CT Renal Stone Study  Result Date: 08/21/2020 CLINICAL DATA:  Left flank pain and hematuria EXAM: CT ABDOMEN AND PELVIS WITHOUT CONTRAST TECHNIQUE: Multidetector CT imaging of the abdomen and pelvis was performed following the standard protocol without IV contrast. COMPARISON:  May 23, 2011 CT abdomen FINDINGS: Lower chest: Lung bases are clear. Hepatobiliary: No focal liver lesions are appreciable on this noncontrast enhanced study. The gallbladder is absent. There is no appreciable biliary duct dilatation. Pancreas: There is no appreciable pancreatic mass or inflammatory focus. Spleen: No splenic lesions are evident. Adrenals/Urinary Tract: Adrenals bilaterally appear normal. Left kidney is subtly edematous. There is no appreciable renal mass on either side.  There is moderate hydronephrosis on the left. There is a 2 mm calculus in the upper pole left kidney, nonobstructing. No evident calculus on the right. There is a calculus in the proximal left ureter at the proximal L4 level on the left measuring 7 x 5 x 5 mm with edema of the left ureter proximal to this calculus. No other ureteral calculi are evident. Urinary bladder is midline with wall thickness within normal limits. Stomach/Bowel: There is no appreciable bowel wall or mesenteric thickening. Terminal ileum appears normal. There is no evident bowel obstruction. There is no free air or portal venous air. Vascular/Lymphatic: There is no abdominal aortic aneurysm. No vascular lesions are appreciable on this noncontrast enhanced study. There is no appreciable adenopathy in the abdomen or pelvis. Reproductive: Uterus is absent. There is a somewhat complex predominantly cystic left adnexal mass measuring 5.1 x 2.6 cm. No other pelvic mass evident. Other: Appendix appears normal. No abscess or ascites in the abdomen or pelvis. Musculoskeletal: There are no blastic or lytic bone lesions. No intramuscular lesions are evident. IMPRESSION: 1. There is a 7 x 5 x 5 mm proximal left ureteral calculus at the L4 level causing moderate hydronephrosis and proximal ureterectasis on the left. Left kidney is subtly edematous. 2.  Nonobstructing 2 mm calculus upper pole left kidney. 3. Mildly complex appearing left predominantly cystic adnexal mass measuring 5.1 x 2.6 cm. Advise correlation with nonemergent pelvic ultrasound to more precisely evaluate the internal architecture of this lesion. Uterus absent. 4. No bowel obstruction. No abscess in the abdomen or pelvis. Appendix appears normal. 5.  Gallbladder absent. Electronically Signed   By: Lowella Grip III M.D.   On: 08/21/2020 11:05    Procedures Procedures (including critical care time)  Medications Ordered in ED Medications  oxyCODONE-acetaminophen  (PERCOCET/ROXICET) 5-325 MG per tablet 1 tablet (1 tablet Oral Given 08/20/20 2011)  oxyCODONE-acetaminophen (PERCOCET/ROXICET) 5-325 MG per tablet 1 tablet (1 tablet Oral Given 08/21/20 0927)  ketorolac (TORADOL) injection 60 mg (60 mg Intramuscular Given 08/21/20 5993)    ED Course  I have  reviewed the triage vital signs and the nursing notes.  Pertinent labs & imaging results that were available during my care of the patient were reviewed by me and considered in my medical decision making (see chart for details).    MDM Rules/Calculators/A&P                          Patient presents with worsening hematuria and lower abdominal discomfort.  Discussed differential diagnosis including urine infection, kidney infection, kidney stone, less likely diverticulitis, other. Discussed plan for CT scan for further delineation as this is new for the patient.  Blood work reviewed showing normal white blood cell count, normal hemoglobin, normal kidney function, urinalysis revealed large hemoglobin and trace leukocytes. Urine culture added on.  Pain meds ordered. CT scan results reviewed showing 7 mm kidney stone in the left with hydronephrosis.  Patient's pain controlled on reassessment, no fever, no leukocytosis, low suspicion for infection however patient does have hemoglobin and leukocytes in the urine so plan for Keflex and follow-up culture with urology.  Discussed close follow-up with urology in the next 48 hours.  Reasons to return discussed. Discussed CT scan showed pelvic mass, patient has hysterectomy history.  We discussed importance of follow-up with gynecology for ultrasound and further work-up to determine if signs of cancer.  Patient has had mild weight loss recently.  No known history or family history of uterine/ovarian cancer.    Final Clinical Impression(s) / ED Diagnoses Final diagnoses:  Hematuria, unspecified type  Lower abdominal pain  Kidney stone on left side  Pelvic mass in  female    Rx / DC Orders ED Discharge Orders         Ordered    ondansetron (ZOFRAN ODT) 4 MG disintegrating tablet        08/21/20 1217    HYDROcodone-acetaminophen (NORCO) 5-325 MG tablet  Every 6 hours PRN        08/21/20 1217    cephALEXin (KEFLEX) 500 MG capsule  2 times daily        08/21/20 1217           Elnora Morrison, MD 08/21/20 1220

## 2020-08-21 NOTE — ED Notes (Signed)
Patient verbalizes understanding of discharge instructions. Opportunity for questioning and answers were provided. Armband removed by staff, pt discharged from ED and ambulated to lobby to return home.   

## 2020-08-22 LAB — URINE CULTURE

## 2020-09-14 ENCOUNTER — Other Ambulatory Visit: Payer: Self-pay | Admitting: Urology

## 2020-09-17 ENCOUNTER — Other Ambulatory Visit: Payer: Self-pay | Admitting: Obstetrics and Gynecology

## 2020-09-17 DIAGNOSIS — R1900 Intra-abdominal and pelvic swelling, mass and lump, unspecified site: Secondary | ICD-10-CM

## 2020-09-24 NOTE — Patient Instructions (Addendum)
DUE TO COVID-19 ONLY ONE VISITOR IS ALLOWED TO COME WITH YOU AND STAY IN THE WAITING ROOM ONLY DURING PRE OP AND PROCEDURE DAY OF SURGERY. THE 1 VISITOR  MAY VISIT WITH YOU AFTER SURGERY IN YOUR PRIVATE ROOM DURING VISITING HOURS ONLY!  YOU NEED TO HAVE A COVID 19 TEST ON: 09/27/20@ 8:20 AM , THIS TEST MUST BE DONE BEFORE SURGERY,  COVID TESTING SITE [address] [place] [address], IT IS ON THE RIGHT GOING OUT [address] APPROXIMATELY  2 MINUTES PAST [employer] ON THE RIGHT. ONCE YOUR COVID TEST IS COMPLETED,  PLEASE BEGIN THE QUARANTINE INSTRUCTIONS AS OUTLINED IN YOUR HANDOUT.                Victoria Craig   Your procedure is scheduled on: 10/01/20   Report to [facility] Main  Entrance   Report to admitting at: 12:00 PM     Call this number if you have problems the morning of surgery [phone]    Remember: Do not eat solid food :After Midnight. Clear liquids until: 11:00 am  CLEAR LIQUID DIET  Foods Allowed                                                                     Foods Excluded  Coffee and tea, regular and decaf                             liquids that you cannot  Plain Jell-O any favor except red or purple                                           see through such as: Fruit ices (not with fruit pulp)                                     milk, soups, orange juice  Iced Popsicles                                    All solid food Carbonated beverages, regular and diet                                    Cranberry, grape and apple juices Sports drinks like Gatorade Lightly seasoned clear broth or consume(fat free) Sugar, honey syrup  Sample Menu Breakfast                                Lunch                                     Supper Cranberry juice  Beef broth                            Chicken broth Jell-O                                     Grape juice                           Apple juice Coffee or tea                         Jell-O                                      Popsicle                                                Coffee or tea                        Coffee or tea  _____________________________________________________________________  BRUSH YOUR TEETH MORNING OF SURGERY AND RINSE YOUR MOUTH OUT, NO CHEWING GUM CANDY OR MINTS.    Take these medicines the morning of surgery with A SIP OF WATER: omeprazole,diltiazem,metoprolol. Lorazepam as needed.                               You may not have any metal on your body including hair pins and              piercings  Do not wear jewelry, make-up, lotions, powders or perfumes, deodorant             Do not wear nail polish on your fingernails.  Do not shave  48 hours prior to surgery.    Do not bring valuables to the hospital. [place].  Contacts, dentures or bridgework may not be worn into surgery.  Leave suitcase in the car. After surgery it may be brought to your room.     Patients discharged the day of surgery will not be allowed to drive home. IF YOU ARE HAVING SURGERY AND GOING HOME THE SAME DAY, YOU MUST HAVE AN ADULT TO DRIVE YOU HOME AND BE WITH YOU FOR 24 HOURS. YOU MAY GO HOME BY TAXI OR UBER OR ORTHERWISE, BUT AN ADULT MUST ACCOMPANY YOU HOME AND STAY WITH YOU FOR 24 HOURS.  Name and phone number of your driver:  Special Instructions: N/A              Please read over the following fact sheets you were given: _____________________________________________________________________          [facility] - Preparing for Surgery Before surgery, you can play an important role.  Because skin is not sterile, your skin needs to be as free of germs as possible.  You can reduce the number of germs on your skin by washing with CHG (chlorahexidine gluconate) soap before surgery.  CHG  is an antiseptic cleaner which kills germs and bonds with the skin to continue killing germs even after  washing. Please DO NOT use if you have an allergy to CHG or antibacterial soaps.  If your skin becomes reddened/irritated stop using the CHG and inform your nurse when you arrive at Short Stay. Do not shave (including legs and underarms) for at least 48 hours prior to the first CHG shower.  You may shave your face/neck. Please follow these instructions carefully:  1.  Shower with CHG Soap the night before surgery and the  morning of Surgery.  2.  If you choose to wash your hair, wash your hair first as usual with your  normal  shampoo.  3.  After you shampoo, rinse your hair and body thoroughly to remove the  shampoo.                           4.  Use CHG as you would any other liquid soap.  You can apply chg directly  to the skin and wash                       Gently with a scrungie or clean washcloth.  5.  Apply the CHG Soap to your body ONLY FROM THE NECK DOWN.   Do not use on face/ open                           Wound or open sores. Avoid contact with eyes, ears mouth and genitals (private parts).                       Wash face,  Genitals (private parts) with your normal soap.             6.  Wash thoroughly, paying special attention to the area where your surgery  will be performed.  7.  Thoroughly rinse your body with warm water from the neck down.  8.  DO NOT shower/wash with your normal soap after using and rinsing off  the CHG Soap.                9.  Pat yourself dry with a clean towel.            10.  Wear clean pajamas.            11.  Place clean sheets on your bed the night of your first shower and do not  sleep with pets. Day of Surgery : Do not apply any lotions/deodorants the morning of surgery.  Please wear clean clothes to the hospital/surgery center.  FAILURE TO FOLLOW THESE INSTRUCTIONS MAY RESULT IN THE CANCELLATION OF YOUR SURGERY PATIENT SIGNATURE_________________________________  NURSE  SIGNATURE__________________________________  ________________________________________________________________________

## 2020-09-25 ENCOUNTER — Ambulatory Visit
Admission: RE | Admit: 2020-09-25 | Discharge: 2020-09-25 | Disposition: A | Discharge status: Home / Self Care | Payer: BC Managed Care – PPO | Source: Ambulatory Visit | Attending: Obstetrics and Gynecology | Admitting: Obstetrics and Gynecology

## 2020-09-25 DIAGNOSIS — R1900 Intra-abdominal and pelvic swelling, mass and lump, unspecified site: Secondary | ICD-10-CM

## 2020-09-26 ENCOUNTER — Other Ambulatory Visit: Payer: Self-pay

## 2020-09-26 ENCOUNTER — Encounter (HOSPITAL_COMMUNITY): Payer: Self-pay

## 2020-09-26 ENCOUNTER — Encounter (HOSPITAL_COMMUNITY)
Admission: RE | Admit: 2020-09-26 | Discharge: 2020-09-26 | Disposition: A | Discharge status: Home / Self Care | Payer: BC Managed Care – PPO | Source: Ambulatory Visit | Attending: Urology | Admitting: Urology

## 2020-09-26 DIAGNOSIS — I4510 Unspecified right bundle-branch block: Secondary | ICD-10-CM | POA: Insufficient documentation

## 2020-09-26 DIAGNOSIS — Z01818 Encounter for other preprocedural examination: Secondary | ICD-10-CM | POA: Insufficient documentation

## 2020-09-26 HISTORY — DX: Personal history of urinary calculi: Z87.442

## 2020-09-26 LAB — CBC
HCT: 48.9 % — ABNORMAL HIGH (ref 36.0–46.0)
Hemoglobin: 16 g/dL — ABNORMAL HIGH (ref 12.0–15.0)
MCH: 27.7 pg (ref 26.0–34.0)
MCHC: 32.7 g/dL (ref 30.0–36.0)
MCV: 84.7 fL (ref 80.0–100.0)
Platelets: 204 10*3/uL (ref 150–400)
RBC: 5.77 MIL/uL — ABNORMAL HIGH (ref 3.87–5.11)
RDW: 14 % (ref 11.5–15.5)
WBC: 5 10*3/uL (ref 4.0–10.5)
nRBC: 0 % (ref 0.0–0.2)

## 2020-09-26 LAB — BASIC METABOLIC PANEL
Anion gap: 9 (ref 5–15)
BUN: 17 mg/dL (ref 8–23)
CO2: 29 mmol/L (ref 22–32)
Calcium: 9.4 mg/dL (ref 8.9–10.3)
Chloride: 101 mmol/L (ref 98–111)
Creatinine, Ser: 0.78 mg/dL (ref 0.44–1.00)
GFR, Estimated: >60 mL/min (ref >60)
Glucose, Bld: 109 mg/dL — ABNORMAL HIGH (ref 70–99)
Potassium: 4 mmol/L (ref 3.5–5.1)
Sodium: 139 mmol/L (ref 135–145)

## 2020-09-26 NOTE — Progress Notes (Signed)
COVID Vaccine Completed: First dose on: 09/16/20 Date COVID Vaccine completed: COVID vaccine manufacturer: Pfizer     PCP - Dr. Rogers Blocker. Cardiologist - No  Chest x-ray -  EKG -  Stress Test -  ECHO -  Cardiac Cath -  Pacemaker/ICD device last checked:  Sleep Study -  CPAP -   Fasting Blood Sugar -  Checks Blood Sugar _____ times a day  Blood Thinner Instructions: Aspirin Instructions: Last Dose:  Anesthesia review: Hx: HTN,mitral valve prolapse.  Patient denies shortness of breath, fever, cough and chest pain at PAT appointment   Patient verbalized understanding of instructions that were given to them at the PAT appointment. Patient was also instructed that they will need to review over the PAT instructions again at home before surgery.

## 2020-09-27 ENCOUNTER — Telehealth: Payer: Self-pay | Admitting: *Deleted

## 2020-09-27 ENCOUNTER — Other Ambulatory Visit (HOSPITAL_COMMUNITY)
Admission: RE | Admit: 2020-09-27 | Discharge: 2020-09-27 | Disposition: A | Discharge status: Home / Self Care | Payer: BC Managed Care – PPO | Source: Ambulatory Visit | Attending: Urology | Admitting: Urology

## 2020-09-27 DIAGNOSIS — Z01812 Encounter for preprocedural laboratory examination: Secondary | ICD-10-CM | POA: Diagnosis present

## 2020-09-27 DIAGNOSIS — Z20822 Contact with and (suspected) exposure to covid-19: Secondary | ICD-10-CM | POA: Diagnosis not present

## 2020-09-27 LAB — SARS CORONAVIRUS 2 (TAT 6-24 HRS): SARS Coronavirus 2: NEGATIVE

## 2020-09-27 NOTE — Telephone Encounter (Signed)
Called the patient and scheduled a new patient appt for 1/31 at 3 pm with Dr Berline Lopes. Patient given the address and phone for the clinic. Patient also given the policy for mask and visitors. Patient's appt scheduled out due to COVID test 1/20 and surgery 1/24.

## 2020-09-28 NOTE — H&P (Signed)
Office Visit Report     09/05/2020   --------------------------------------------------------------------------------   Victoria Craig  MRN: [id number]  DOB: 01/15/1956, 65 year old Female  SSN:    PRIMARY CARE:  Kevan Ny, MD  REFERRING:  Kevan Ny, MD  PROVIDER:  Raynelle Bring, M.D.  TREATING:  Daine Gravel, NP  LOCATION:  [facility] [phone]     --------------------------------------------------------------------------------   CC/HPI: Left ureteral calculus   Victoria Craig presents today as a new patient after developing the acute onset of severe left lower quadrant pain on Sunday. She also developed gross hematuria although states that in retrospect she may been having some hematuria intermittently for the past few weeks. She denies a prior history of kidney stones. She has had some mild nausea and vomiting. Her pain is currently controlled. She has been taking Percocet as needed. She denies fever. She did have some bacteriuria noted on her urinalysis in the hospital although it was felt this could represent possible contamination. Urine culture is pending. She was begun empirically on cephalexin. Her CT scan in the emergency room demonstrated a 7 x 5 mm proximal left ureteral calculus. She also had a nonobstructing left renal calculus.   Interval: She has been undergoing MET for a left proximal stone. She denies interval passage of stone fragments. She denies fevers, chills and vomiting. She reports intermittent left flank pain that continues associated with intertmittent hematuria, nausea, and frequency of urination.     ALLERGIES: None   MEDICATIONS: Metoprolol Succinate  Metoprolol Succinate 25 mg tablet, extended release 24 hr 1 tablet PO BID  Oxycodone-Acetaminophen 5 mg-325 mg tablet 1 tablet PO PRN  Cozaar  Diltiazem 12Hr Er  Lorazepam  Triamterene-Hydrochlorothiazid 37.5 mg-25 mg capsule     GU PSH: Hysterectomy     NON-GU PSH: Bilateral Tubal  Ligation Cholecystectomy (laparoscopic) C-Section     GU PMH: Ureteral calculus - 08/22/2020    NON-GU PMH: Anxiety Depression GERD Hypertension    FAMILY HISTORY: None   SOCIAL HISTORY: None   REVIEW OF SYSTEMS:    GU Review Female:   Patient reports frequent urination. Patient denies hard to postpone urination, burning /pain with urination, get up at night to urinate, leakage of urine, stream starts and stops, trouble starting your stream, have to strain to urinate, and being pregnant.  Gastrointestinal (Upper):   Patient reports nausea. Patient denies vomiting and indigestion/ heartburn.  Gastrointestinal (Lower):   Patient denies diarrhea and constipation.  Constitutional:   Patient denies fever, night sweats, weight loss, and fatigue.  Skin:   Patient denies itching and skin rash/ lesion.  Eyes:   Patient denies blurred vision and double vision.  Ears/ Nose/ Throat:   Patient denies sore throat and sinus problems.  Hematologic/Lymphatic:   Patient denies swollen glands and easy bruising.  Cardiovascular:   Patient denies leg swelling and chest pains.  Respiratory:   Patient denies cough and shortness of breath.  Endocrine:   Patient denies excessive thirst.  Musculoskeletal:   Patient reports back pain. Patient denies joint pain.  Neurological:   Patient denies headaches and dizziness.  Psychologic:   Patient denies depression and anxiety.   VITAL SIGNS:      12 /29/2021 11:20 AM  Weight 195 lb / 88.45 kg  BP 124/80 mmHg  Pulse 82 /min  Temperature 97.1 F / 36.1 C   GU PHYSICAL EXAMINATION:      Notes: no CVAT   MULTI-SYSTEM PHYSICAL EXAMINATION:  Constitutional: Well-nourished. No physical deformities. Normally developed. Good grooming.  Respiratory: No labored breathing, no use of accessory muscles.   Cardiovascular: Normal temperature, normal extremity pulses, no swelling, no varicosities.  Skin: No paleness, no jaundice, no cyanosis. No lesion, no ulcer, no  rash.  Neurologic / Psychiatric: Oriented to time, oriented to place, oriented to person. No depression, no anxiety, no agitation.  Gastrointestinal: No mass, no tenderness, no rigidity, non obese abdomen.  Musculoskeletal: Normal gait and station of head and neck.     Complexity of Data:  Source Of History:  Patient, Medical Record Summary  Records Review:   Previous Doctor Records, Previous Hospital Records, Previous Patient Records  Urine Test Review:   Urinalysis  X-Ray Review: C.T. Abdomen/Pelvis: Reviewed Films. Reviewed Report.     09/05/20  Urinalysis  Urine Appearance Clear   Urine Color Yellow   Urine Glucose Neg mg/dL  Urine Bilirubin Neg mg/dL  Urine Ketones Neg mg/dL  Urine Specific Gravity 1.015   Urine Blood Neg ery/uL  Urine pH 6.0   Urine Protein Neg mg/dL  Urine Urobilinogen 0.2 mg/dL  Urine Nitrites Neg   Urine Leukocyte Esterase 1+ leu/uL  Urine WBC/hpf 0 - 5/hpf   Urine RBC/hpf NS (Not Seen)   Urine Epithelial Cells 0 - 5/hpf   Urine Bacteria Rare (0-9/hpf)   Urine Mucous Not Present   Urine Yeast NS (Not Seen)   Urine Trichomonas Not Present   Urine Cystals NS (Not Seen)   Urine Casts NS (Not Seen)   Urine Sperm Not Present    PROCEDURES:         Renal Ultrasound (Limited) - 19509  Kidney: Left Length: 11.8 cm Depth: 6.8 cm Cortical Width: 2.0 cm Width: 5.6 cm    Left Kidney/Ureter:  Mild hydro. Multiple calcifications  Bladder:  PVR 122.01 ml      Patient confirmed No Neulasta OnPro Device.            KUB - [id number]  A single view of the abdomen is obtained. Normal bowel gas pattern. Bilateral renal shadows visualized. Along the expected course of the left ureter there is an opacity noted at the level of L3. there are stable pheboliths within the pelvic inlet.       Patient confirmed No Neulasta OnPro Device.            Urinalysis w/Scope Dipstick Dipstick Cont'd Micro  Color: Yellow Bilirubin: Neg mg/dL WBC/hpf: 0 - 5/hpf   Appearance: Clear Ketones: Neg mg/dL RBC/hpf: NS (Not Seen)  Specific Gravity: 1.015 Blood: Neg ery/uL Bacteria: Rare (0-9/hpf)  pH: 6.0 Protein: Neg mg/dL Cystals: NS (Not Seen)  Glucose: Neg mg/dL Urobilinogen: 0.2 mg/dL Casts: NS (Not Seen)    Nitrites: Neg Trichomonas: Not Present    Leukocyte Esterase: 1+ leu/uL Mucous: Not Present      Epithelial Cells: 0 - 5/hpf      Yeast: NS (Not Seen)      Sperm: Not Present    ASSESSMENT:      ICD-10 Details  1 GU:   Ureteral calculus - N20.1 Left, Acute, Systemic Symptoms   PLAN:            Medications Refill Meds: Oxycodone-Acetaminophen 5 mg-325 mg tablet 1 tablet PO PRN   #20  0 Refill(s)    New Meds: Tamsulosin Hcl 0.4 mg capsule 1 capsule PO Q HS   #30  0 Refill(s)            Orders X-Rays:  Renal Ultrasound (Limited) - LEft renal US.           Schedule Return Visit/Planned Activity: Next Available Appointment - Schedule Surgery          Document Letter(s):  Created for Patient: Clinical Summary         Notes:   Urinalysis is not concerning for infectious process. Discussed definitive stone management options including ESWL and URS. Risks of both procedures explained in detail. She would like to pursue URS as this is most likely to render her stone free. She understands the risks of this procedure include infection, general anesthesia, bleeding and risks to surrounding structures. She verbalized her understanding. Green sheet placed. Return precautions advised.     * Signed by Daine Gravel, NP on 09/10/20 at 6:26 PM (EST)*

## 2020-10-01 ENCOUNTER — Ambulatory Visit (HOSPITAL_COMMUNITY)
Admission: RE | Admit: 2020-10-01 | Discharge: 2020-10-01 | Disposition: A | Discharge status: Home / Self Care | Payer: BC Managed Care – PPO | Attending: Urology | Admitting: Urology

## 2020-10-01 ENCOUNTER — Ambulatory Visit (HOSPITAL_COMMUNITY): Payer: BC Managed Care – PPO

## 2020-10-01 ENCOUNTER — Ambulatory Visit (HOSPITAL_COMMUNITY): Payer: BC Managed Care – PPO | Admitting: Certified Registered"

## 2020-10-01 ENCOUNTER — Encounter (HOSPITAL_COMMUNITY)
Admission: RE | Disposition: A | Discharge status: Home / Self Care | Payer: Self-pay | Source: Home / Self Care | Attending: Urology

## 2020-10-01 ENCOUNTER — Encounter (HOSPITAL_COMMUNITY): Payer: Self-pay | Admitting: Urology

## 2020-10-01 DIAGNOSIS — N201 Calculus of ureter: Secondary | ICD-10-CM | POA: Diagnosis not present

## 2020-10-01 DIAGNOSIS — Z79899 Other long term (current) drug therapy: Secondary | ICD-10-CM | POA: Diagnosis not present

## 2020-10-01 DIAGNOSIS — N202 Calculus of kidney with calculus of ureter: Secondary | ICD-10-CM | POA: Diagnosis present

## 2020-10-01 HISTORY — PX: CYSTOSCOPY/URETEROSCOPY/HOLMIUM LASER/STENT PLACEMENT: SHX6546

## 2020-10-01 SURGERY — CYSTOSCOPY/URETEROSCOPY/HOLMIUM LASER/STENT PLACEMENT
Anesthesia: General | Site: Ureter | Laterality: Left

## 2020-10-01 MED ORDER — OXYCODONE-ACETAMINOPHEN 5-325 MG PO TABS: 1.0000 | ORAL_TABLET | ORAL | 0 refills | Status: AC | PRN

## 2020-10-01 MED ORDER — ACETAMINOPHEN 10 MG/ML IV SOLN: 1000.0000 mg | Freq: Once | INTRAVENOUS | Status: DC | PRN

## 2020-10-01 MED ORDER — FENTANYL CITRATE (PF) 100 MCG/2ML IJ SOLN
INTRAMUSCULAR | Status: DC | PRN
  Administered 2020-10-01 (×2): 25 ug via INTRAVENOUS

## 2020-10-01 MED ORDER — FENTANYL CITRATE (PF) 100 MCG/2ML IJ SOLN
INTRAMUSCULAR | Status: AC
  Administered 2020-10-01: 50 ug via INTRAVENOUS
  Filled 2020-10-01: qty 2

## 2020-10-01 MED ORDER — SODIUM CHLORIDE 0.9 % IR SOLN
Status: DC | PRN
  Administered 2020-10-01: 3000 mL

## 2020-10-01 MED ORDER — PROPOFOL 10 MG/ML IV BOLUS
INTRAVENOUS | Status: AC
  Filled 2020-10-01: qty 20

## 2020-10-01 MED ORDER — ACETAMINOPHEN 160 MG/5ML PO SOLN: 325.0000 mg | ORAL | Status: DC | PRN

## 2020-10-01 MED ORDER — DEXAMETHASONE SODIUM PHOSPHATE 10 MG/ML IJ SOLN
INTRAMUSCULAR | Status: AC
  Filled 2020-10-01: qty 1

## 2020-10-01 MED ORDER — OXYCODONE HCL 5 MG/5ML PO SOLN: 5.0000 mg | Freq: Once | ORAL | Status: DC | PRN

## 2020-10-01 MED ORDER — LIDOCAINE 2% (20 MG/ML) 5 ML SYRINGE
INTRAMUSCULAR | Status: DC | PRN
  Administered 2020-10-01: 50 mg via INTRAVENOUS

## 2020-10-01 MED ORDER — LIDOCAINE HCL (PF) 2 % IJ SOLN
INTRAMUSCULAR | Status: AC
  Filled 2020-10-01: qty 5

## 2020-10-01 MED ORDER — OXYCODONE HCL 5 MG PO TABS: 5.0000 mg | ORAL_TABLET | Freq: Once | ORAL | Status: DC | PRN

## 2020-10-01 MED ORDER — ONDANSETRON HCL 4 MG/2ML IJ SOLN: 4.0000 mg | Freq: Once | INTRAMUSCULAR | Status: DC | PRN

## 2020-10-01 MED ORDER — IOHEXOL 300 MG/ML  SOLN
INTRAMUSCULAR | Status: DC | PRN
  Administered 2020-10-01: 5 mL

## 2020-10-01 MED ORDER — PROPOFOL 10 MG/ML IV BOLUS
INTRAVENOUS | Status: DC | PRN
  Administered 2020-10-01: 200 mg via INTRAVENOUS

## 2020-10-01 MED ORDER — DEXAMETHASONE SODIUM PHOSPHATE 10 MG/ML IJ SOLN
INTRAMUSCULAR | Status: DC | PRN
  Administered 2020-10-01: 4 mg via INTRAVENOUS

## 2020-10-01 MED ORDER — ACETAMINOPHEN 325 MG PO TABS: 325.0000 mg | ORAL_TABLET | ORAL | Status: DC | PRN

## 2020-10-01 MED ORDER — ONDANSETRON HCL 4 MG/2ML IJ SOLN
INTRAMUSCULAR | Status: DC | PRN
  Administered 2020-10-01: 4 mg via INTRAVENOUS

## 2020-10-01 MED ORDER — MEPERIDINE HCL 50 MG/ML IJ SOLN: 6.2500 mg | INTRAMUSCULAR | Status: DC | PRN

## 2020-10-01 MED ORDER — FENTANYL CITRATE (PF) 100 MCG/2ML IJ SOLN
INTRAMUSCULAR | Status: AC
  Filled 2020-10-01: qty 2

## 2020-10-01 MED ORDER — CEFAZOLIN SODIUM-DEXTROSE 2-4 GM/100ML-% IV SOLN
2.0000 g | Freq: Once | INTRAVENOUS | Status: AC
  Administered 2020-10-01: 2 g via INTRAVENOUS
  Filled 2020-10-01: qty 100

## 2020-10-01 MED ORDER — LACTATED RINGERS IV SOLN: INTRAVENOUS | Status: DC

## 2020-10-01 MED ORDER — ORAL CARE MOUTH RINSE: 15.0000 mL | Freq: Once | OROMUCOSAL | Status: DC

## 2020-10-01 MED ORDER — CHLORHEXIDINE GLUCONATE 0.12 % MT SOLN: 15.0000 mL | Freq: Once | OROMUCOSAL | Status: DC

## 2020-10-01 MED ORDER — 0.9 % SODIUM CHLORIDE (POUR BTL) OPTIME
TOPICAL | Status: DC | PRN
  Administered 2020-10-01: 1000 mL

## 2020-10-01 MED ORDER — FENTANYL CITRATE (PF) 100 MCG/2ML IJ SOLN: 25.0000 ug | INTRAMUSCULAR | Status: DC | PRN

## 2020-10-01 SURGICAL SUPPLY — 21 items
BAG URO CATCHER STRL LF (MISCELLANEOUS) ×2 IMPLANT
BASKET ZERO TIP NITINOL 2.4FR (BASKET) ×2 IMPLANT
CATH INTERMIT  6FR 70CM (CATHETERS) ×2 IMPLANT
CLOTH BEACON ORANGE TIMEOUT ST (SAFETY) ×2 IMPLANT
FIBER LASER MOSES 365 DFL (Laser) ×2 IMPLANT
GLOVE SURG ENC TEXT LTX SZ7.5 (GLOVE) ×2 IMPLANT
GOWN STRL REUS W/TWL LRG LVL3 (GOWN DISPOSABLE) ×2 IMPLANT
GUIDEWIRE STR DUAL SENSOR (WIRE) ×4 IMPLANT
GUIDEWIRE ZIPWRE .038 STRAIGHT (WIRE) IMPLANT
IV NS 1000ML (IV SOLUTION) ×1
IV NS 1000ML BAXH (IV SOLUTION) ×1 IMPLANT
KIT TURNOVER KIT A (KITS) IMPLANT
LASER FIB FLEXIVA PULSE ID 365 (Laser) IMPLANT
MANIFOLD NEPTUNE II (INSTRUMENTS) ×2 IMPLANT
PACK CYSTO (CUSTOM PROCEDURE TRAY) ×2 IMPLANT
SHEATH URETERAL 12FRX35CM (MISCELLANEOUS) IMPLANT
STENT URET 6FRX24 CONTOUR (STENTS) ×2 IMPLANT
TRACTIP FLEXIVA PULS ID 200XHI (Laser) IMPLANT
TRACTIP FLEXIVA PULSE ID 200 (Laser)
TUBING CONNECTING 10 (TUBING) ×2 IMPLANT
TUBING UROLOGY SET (TUBING) ×2 IMPLANT

## 2020-10-01 NOTE — Interval H&P Note (Signed)
History and Physical Interval Note:  10/01/2020 1:14 PM  Victoria Craig  has presented today for surgery, with the diagnosis of LEFT RENAL CALCULI.  The various methods of treatment have been discussed with the patient and family. After consideration of risks, benefits and other options for treatment, the patient has consented to  Procedure(s): CYSTOSCOPY/RETROGRADE/URETEROSCOPY/HOLMIUM LASER/STENT PLACEMENT (Left) as a surgical intervention.  The patient's history has been reviewed, patient examined, no change in status, stable for surgery.  I have reviewed the patient's chart and labs.  Questions were answered to the patient's satisfaction.     [employer]

## 2020-10-01 NOTE — Op Note (Signed)
Preoperative diagnosis: Left ureteral calculus  Postoperative diagnosis: Left ureteral calculus  Procedure:  1. Cystoscopy 2. Left ureteroscopy and stone removal 3. Ureteroscopic laser lithotripsy 4. Left ureteral stent placement (6 x 24 - no string) 5. Left retrograde pyelography with interpretation  Surgeon: Pryor Curia. M.D.  Anesthesia: General  Complications: None  Intraoperative findings: Left retrograde pyelography demonstrated a filling defect within the proximal left ureter consistent with the patient's known calculus without other abnormalities.  EBL: Minimal  Specimens: 1. Left ureteral calculus  Disposition of specimens: [facility] for stone analysis  Indication: Victoria Craig is a 65 y.o. year old patient with urolithiasis and a symptomatic left ureteral stone. After reviewing the management options for treatment, the patient elected to proceed with the above surgical procedure(s). We have discussed the potential benefits and risks of the procedure, side effects of the proposed treatment, the likelihood of the patient achieving the goals of the procedure, and any potential problems that might occur during the procedure or recuperation. Informed consent has been obtained.  Description of procedure:  The patient was taken to the operating room and general anesthesia was induced.  The patient was placed in the dorsal lithotomy position, prepped and draped in the usual sterile fashion, and preoperative antibiotics were administered. A preoperative time-out was performed.   Cystourethroscopy was performed.  The patient's urethra was examined and was normal. The bladder was then systematically examined in its entirety. There was no evidence for any bladder tumors, stones, or other mucosal pathology.    Attention then turned to the left ureteral orifice and a ureteral catheter was used to intubate the ureteral orifice.  Omnipaque contrast was  injected through the ureteral catheter and a retrograde pyelogram was performed with findings as dictated above.  A 0.38 sensor guidewire was then advanced up the left ureter into the renal pelvis under fluoroscopic guidance. The 6 Fr semirigid ureteroscope was then advanced into the ureter next to the guidewire and the calculus was identified.   The stone was then fragmented with the 365 micron holmium laser fiber on a setting of 0.6 J and frequency of 6 Hz.   All stones were then removed from the ureter with a zero tip nitinol basket.  Reinspection of the ureter revealed no remaining visible stones or fragments.   The wire was then backloaded through the cystoscope and a ureteral stent was advance over the wire using Seldinger technique.  The stent was positioned appropriately under fluoroscopic and cystoscopic guidance.  The wire was then removed with an adequate stent curl noted in the renal pelvis as well as in the bladder.  The bladder was then emptied and the procedure ended.  The patient appeared to tolerate the procedure well and without complications.  The patient was able to be awakened and transferred to the recovery unit in satisfactory condition.

## 2020-10-01 NOTE — Anesthesia Preprocedure Evaluation (Signed)
Anesthesia Evaluation  Patient identified by MRN, date of birth, ID band Patient awake    Reviewed: Allergy & Precautions, NPO status , Patient's Chart, lab work & pertinent test results, reviewed documented beta blocker date and time   Airway Mallampati: II       Dental no notable dental hx.    Pulmonary neg pulmonary ROS,    Pulmonary exam normal        Cardiovascular hypertension, Pt. on medications and Pt. on home beta blockers Normal cardiovascular exam     Neuro/Psych PSYCHIATRIC DISORDERS Depression negative neurological ROS     GI/Hepatic Neg liver ROS, GERD  Medicated and Controlled,  Endo/Other  Morbid obesity  Renal/GU   negative genitourinary   Musculoskeletal   Abdominal Normal abdominal exam  (+)   Peds  Hematology   Anesthesia Other Findings   Reproductive/Obstetrics                             Anesthesia Physical Anesthesia Plan  ASA: III  Anesthesia Plan: General   Post-op Pain Management:    Induction:   PONV Risk Score and Plan: 4 or greater and Ondansetron, Dexamethasone and Midazolam  Airway Management Planned: LMA  Additional Equipment: None  Intra-op Plan:   Post-operative Plan: Extubation in OR  Informed Consent: I have reviewed the patients History and Physical, chart, labs and discussed the procedure including the risks, benefits and alternatives for the proposed anesthesia with the patient or authorized representative who has indicated his/her understanding and acceptance.     Dental advisory given  Plan Discussed with: CRNA  Anesthesia Plan Comments:         Anesthesia Quick Evaluation

## 2020-10-01 NOTE — Transfer of Care (Signed)
Immediate Anesthesia Transfer of Care Note  Patient: Victoria Craig  Procedure(s) Performed: CYSTOSCOPY/LEFT RETROGRADE/URETEROSCOPY/HOLMIUM LASER/STENT PLACEMENT (Left Ureter)  Patient Location: PACU  Anesthesia Type:General  Level of Consciousness: awake, alert  and oriented  Airway & Oxygen Therapy: Patient Spontanous Breathing and Patient connected to nasal cannula oxygen  Post-op Assessment: Report given to RN, Post -op Vital signs reviewed and stable and Patient moving all extremities X 4  Post vital signs: Reviewed and stable  Last Vitals:  Vitals Value Taken Time  BP 129/82 10/01/20 1404  Temp    Pulse 71 10/01/20 1406  Resp 21 10/01/20 1406  SpO2 100 % 10/01/20 1406  Vitals shown include unvalidated device data.  Last Pain:  Vitals:   10/01/20 1205  TempSrc:   PainSc: 6       Patients Stated Pain Goal: 3 (56/[id number]/94 [id number])  Complications: No complications documented.

## 2020-10-01 NOTE — Anesthesia Postprocedure Evaluation (Signed)
Anesthesia Post Note  Patient: Victoria Craig  Procedure(s) Performed: CYSTOSCOPY/LEFT RETROGRADE/URETEROSCOPY/HOLMIUM LASER/STENT PLACEMENT (Left Ureter)     Patient location during evaluation: PACU Anesthesia Type: General Level of consciousness: awake Pain management: pain level controlled Vital Signs Assessment: post-procedure vital signs reviewed and stable Respiratory status: spontaneous breathing Cardiovascular status: stable Postop Assessment: no apparent nausea or vomiting Anesthetic complications: no   No complications documented.  Last Vitals:  Vitals:   10/01/20 1404 10/01/20 1415  BP: 129/82 (!) 130/92  Pulse: 71 75  Resp: 12 17  Temp: 36.4 C   SpO2: 100% 98%    Last Pain:  Vitals:   10/01/20 1421  TempSrc:   PainSc: 2                  John Stoney Bang

## 2020-10-01 NOTE — Discharge Instructions (Signed)

## 2020-10-01 NOTE — Anesthesia Procedure Notes (Signed)
Procedure Name: LMA Insertion Date/Time: 10/01/2020 1:17 PM Performed by: Niel Hummer, CRNA Pre-anesthesia Checklist: Patient identified, Emergency Drugs available, Suction available and Patient being monitored Patient Re-evaluated:Patient Re-evaluated prior to induction Oxygen Delivery Method: Circle system utilized Preoxygenation: Pre-oxygenation with 100% oxygen Induction Type: IV induction LMA: LMA inserted LMA Size: 4.0 Number of attempts: 1 Dental Injury: Teeth and Oropharynx as per pre-operative assessment

## 2020-10-02 ENCOUNTER — Encounter (HOSPITAL_COMMUNITY): Payer: Self-pay | Admitting: Urology

## 2020-10-05 ENCOUNTER — Encounter: Payer: Self-pay | Admitting: Gynecologic Oncology

## 2020-10-08 ENCOUNTER — Other Ambulatory Visit: Payer: Self-pay

## 2020-10-08 ENCOUNTER — Encounter: Payer: Self-pay | Admitting: Gynecologic Oncology

## 2020-10-08 ENCOUNTER — Inpatient Hospital Stay: Payer: BC Managed Care – PPO | Attending: Gynecologic Oncology | Admitting: Gynecologic Oncology

## 2020-10-08 VITALS — BP 138/93 | HR 91 | Temp 97.9°F | Resp 18 | Ht 59.0 in | Wt 211.2 lb

## 2020-10-08 DIAGNOSIS — Z79899 Other long term (current) drug therapy: Secondary | ICD-10-CM | POA: Insufficient documentation

## 2020-10-08 DIAGNOSIS — D398 Neoplasm of uncertain behavior of other specified female genital organs: Secondary | ICD-10-CM

## 2020-10-08 DIAGNOSIS — I10 Essential (primary) hypertension: Secondary | ICD-10-CM | POA: Diagnosis not present

## 2020-10-08 DIAGNOSIS — K219 Gastro-esophageal reflux disease without esophagitis: Secondary | ICD-10-CM | POA: Insufficient documentation

## 2020-10-08 DIAGNOSIS — I341 Nonrheumatic mitral (valve) prolapse: Secondary | ICD-10-CM | POA: Diagnosis not present

## 2020-10-08 DIAGNOSIS — E7800 Pure hypercholesterolemia, unspecified: Secondary | ICD-10-CM | POA: Diagnosis not present

## 2020-10-08 DIAGNOSIS — Z6841 Body Mass Index (BMI) 40.0 and over, adult: Secondary | ICD-10-CM | POA: Diagnosis not present

## 2020-10-08 DIAGNOSIS — E669 Obesity, unspecified: Secondary | ICD-10-CM | POA: Insufficient documentation

## 2020-10-08 DIAGNOSIS — N9489 Other specified conditions associated with female genital organs and menstrual cycle: Secondary | ICD-10-CM

## 2020-10-08 NOTE — Patient Instructions (Signed)
It was a pleasure meeting you today. I will call you once I have the results of your MRI and we will discuss next steps. I think that a mass the size of what was seen on your CT and ultrasound is unlikely to be the cause of pelvic pain. If the cyst looks benign on MRI and appears to be ovarian, then we will likely follow it to make sure it doesn't grow. If there are features of the mass on MRI that are concerning for possible cancer, then we will discuss scheduling surgery.

## 2020-10-08 NOTE — Progress Notes (Signed)
GYNECOLOGIC ONCOLOGY NEW PATIENT CONSULTATION   Patient Name: Victoria Craig  Patient Age: 65 y.o. Date of Service: 10/08/20 Referring Provider: Dr. Vilinda Boehringer   Primary Care Provider: Rogers Blocker, MD Consulting Provider: Jeral Pinch, MD   Assessment/Plan:  Postmenopausal patient on long-time estrogen repletion after hysterectomy with incidental finding of a complex adnexal mass during work-up for kidney stone.  I reviewed in detail with the patient her CT scan and ultrasound findings.  We discussed the limitations of CT scan in terms of evaluating pelvic organs.  The patient's history is somewhat difficult to interpret.  She was under the impression that one of her ovaries was left in situ however she was started on estrogen repletion very shortly after her surgery.  We discussed that even if both ovaries were removed, there may be some residual ovarian tissue along the ovarian vasculature.  An ovarian remnant would have the potential of developing a cyst or mass.  Given the patient's size as well as location of the mass higher in the pelvis, I think ultrasound is probably somewhat limited in evaluating this.  I recommended that we proceed with a pelvic ultrasound to better characterize this cystic mass.  If it in fact looks ovarian in nature, then depending on its characteristics, we discussed management options could be close imaging surveillance (to evaluate for any change in size or character) versus surgery.  Given her surgical history, which includes multiple pelvic surgeries including one for lysis of adhesions, I would recommend surgical intervention only if significant concern for a premalignant or malignant condition was found based on the MRI results.  Depending on MRI results and whether this mass looks adnexal in origin, we may obtain tumor markers to help aid in the decision regarding surgery.  The patient was amenable with this plan.  I will call her with the MRI results and we  will discuss next steps in management.  A copy of this note was sent to the patient's referring provider.   65 minutes of total time was spent for this patient encounter, including preparation, face-to-face counseling with the patient and coordination of care, and documentation of the encounter.   Jeral Pinch, MD  Division of Gynecologic Oncology  Department of Obstetrics and Gynecology  [facility]  ___________________________________________  Chief Complaint: Chief Complaint  Patient presents with  . Adnexal mass    History of Present Illness:  Victoria Craig is a 65 y.o. y.o. female who is seen in consultation at the request of Dr. Brien Mates for an evaluation of an adnexal mass.  The patient has a gynecologic history notable for total hysterectomy and unilateral versus bilateral salpingo-oophorectomy in 1981 for history of fibroids and ovarian cyst.  She thinks that her right ovary was removed but was told by physician at the time that they were planning to leave her left ovary given her young age.  She remembers being started on an estrogen patch within a year of her surgery and has been on replacement estrogen since that time.  She denies having symptoms of menopause that recently tried to reduce from 2 patches a week down to 1 and began having significant hot flashes.  Her hysterectomy was preceded by 2 C-sections as well as a tubal ligation.  She underwent surgery with lysis of adhesions at some point after her hysterectomy secondary to pain and related adhesions.  He describes this as having scar tissue along her low pelvis or pubic bone.  She was  recently seen in the emergency department on 14 December secondary to abdominal pain and hematuria.  She was diagnosed with kidney stones and subsequently underwent cystoscopy with left ureteroscopic laser lithotripsy and stone removal, stent placement on 1/24.  She is scheduled for follow-up tomorrow to  have the stent removed.  She has done well since surgery although has some discomfort related to the stent.  She feels tired.  She endorses pain when she urinates now as well as urinary frequency.  She is not sleeping very well.  Prior to mid December, she describes having some lower abdominal and pelvic pain when she would try to bend over, which she endorses having for a long time.  She had some spotting and hematuria related to recent kidney stone but otherwise denies any vaginal bleeding or discharge.  She has been unable to lose weight despite diet changes.  Her appetite over the last week since surgery has been decreased, she had some nausea and one episode of emesis related to postoperative state.  She has baseline constipation for which she takes Linzess, often without relief.  Patient lives in [place] with her husband.  Her son intermittently stays with them.  She is retired.  PAST MEDICAL HISTORY:  Past Medical History:  Diagnosis Date  . Acute maxillary sinusitis   . Aphthous ulcer   . Arthritis   . Depression   . Essential hypertension, malignant   . GERD (gastroesophageal reflux disease)   . History of kidney stones   . Lateral meniscal tear    right knee  . Mitral valve prolapse    normal ECHO 12-01-12, EF 60%  . Obesity, unspecified   . Pure hypercholesterolemia   . Sleep disturbance      PAST SURGICAL HISTORY:  Past Surgical History:  Procedure Laterality Date  . ABDOMINAL HYSTERECTOMY  1986  . BREAST BIOPSY Left 06/2018  . CESAREAN SECTION  77,81  . CHOLECYSTECTOMY  2011  . COLONOSCOPY    . CYSTOSCOPY/URETEROSCOPY/HOLMIUM LASER/STENT PLACEMENT Left 10/01/2020   Procedure: CYSTOSCOPY/LEFT RETROGRADE/URETEROSCOPY/HOLMIUM LASER/STENT PLACEMENT;  Surgeon: Raynelle Bring, MD;  Location: [facility];  Service: Urology;  Laterality: Left;  . DISTAL BICEPS TENDON REPAIR Right 04/15/2013   Procedure: DISTAL BICEPS TENDON REPAIR;  Surgeon: Marybelle Killings, MD;  Location: [place];  Service: Orthopedics;  Laterality: Right;  Right Distal Biceps Tendon Repair  . H/O Event monitor  2014   Palpitations  . KNEE ARTHROSCOPY Right 04/06/2015   Procedure: Right Knee Arthroscopy, Partial Lateral Menisectomy, Debridement Parameniscal Cyst;  Surgeon: Marybelle Killings, MD;  Location: [place];  Service: Orthopedics;  Laterality: Right;  . KNEE ARTHROSCOPY WITH LATERAL MENISECTOMY Right 04/06/2015   Procedure: KNEE ARTHROSCOPY WITH PARTIAL LATERAL MENISECTOMY;  Surgeon: Marybelle Killings, MD;  Location: [place];  Service: Orthopedics;  Laterality: Right;  . LYSIS OF ADHESION     reports having surgery after her hysterectomy for scar tissue and related pain  . TRIGGER FINGER RELEASE Right 05/08/2014   Procedure: Excision Right Palm Mass, Trigger Finger Release Right Ring Finger;  Surgeon: Marybelle Killings, MD;  Location: [place];  Service: Orthopedics;  Laterality: Right;  . TUBAL LIGATION      OB/GYN HISTORY:  OB History  Gravida Para Term Preterm AB Living  2 2          SAB IAB Ectopic Multiple Live Births               #  Outcome Date GA Lbr Len/2nd Weight Sex Delivery Anes PTL Lv  2 Para           1 Para             No LMP recorded. Patient has had a hysterectomy.  Age at menarche: 39 Age at menopause: Is unsure given that she was started on estrogen repletion shortly after her hysterectomy and did not have any menopausal symptoms Hx of HRT: Yes has been on an estrogen patch since her late 37s Hx of STDs: Denies Last pap: Unsure but it has been likely since before her hysterectomy History of abnormal pap smears: Thinks that she has remote history of an abnormal Pap smear that was treated with cryotherapy  SCREENING STUDIES:  Last mammogram: 06/2019  Last colonoscopy: Within the last 10 years  MEDICATIONS: Outpatient Encounter Medications as of 10/08/2020  Medication Sig  . acetaminophen (TYLENOL) 500 MG tablet Take 1,000 mg by  mouth every 6 (six) hours as needed for moderate pain.  . cholecalciferol (VITAMIN D3) 25 MCG (1000 UNIT) tablet Take 1,000 Units by mouth daily.  Marland Kitchen diltiazem (CARDIZEM CD) 120 MG 24 hr capsule Take 120 mg by mouth daily.  Marland Kitchen estradiol (VIVELLE-DOT) 0.1 MG/24HR Place 1 patch onto the skin 2 (two) times a week. New patch on Tuesdays and Thursdays  . LORazepam (ATIVAN) 0.5 MG tablet Take 0.5 mg by mouth 2 (two) times daily as needed for anxiety.  . metoprolol succinate (TOPROL-XL) 25 MG 24 hr tablet Take 25 mg by mouth 2 (two) times daily.  . Multiple Vitamin (MULTIVITAMIN WITH MINERALS) TABS tablet Take 1 tablet by mouth daily.  Marland Kitchen omeprazole (PRILOSEC) 20 MG capsule TAKE 1 CAPSULE BY MOUTH TWICE A DAY BEFORE A MEAL (PLEASE MAKE APPOINTMENT) (Patient taking differently: Take 20 mg by mouth daily.)  . ondansetron (ZOFRAN ODT) 4 MG disintegrating tablet 4mg  ODT q4 hours prn nausea/vomit (Patient taking differently: Take 4 mg by mouth every 4 (four) hours as needed for vomiting or nausea.)  . oxyCODONE-acetaminophen (PERCOCET) 5-325 MG tablet Take 1 tablet by mouth every 4 (four) hours as needed for severe pain.  Marland Kitchen oxyCODONE-acetaminophen (PERCOCET/ROXICET) 5-325 MG tablet Take 1-2 tablets by mouth every 6 (six) hours as needed for pain.  . tamsulosin (FLOMAX) 0.4 MG CAPS capsule Take 0.4 mg by mouth at bedtime.  . triamterene-hydrochlorothiazide (DYAZIDE) 37.5-25 MG per capsule Take 1 capsule by mouth daily.  . [DISCONTINUED] cyclobenzaprine (FLEXERIL) 10 MG tablet Take 10 mg by mouth 3 (three) times daily as needed for muscle spasms.  . [DISCONTINUED] ibuprofen (ADVIL) 600 MG tablet Take 600 mg by mouth 3 (three) times daily as needed for moderate pain.   No facility-administered encounter medications on file as of 10/08/2020.    ALLERGIES:  No Known Allergies   FAMILY HISTORY:  Family History  Problem Relation Age of Onset  . Heart disease Mother   . Heart disease Father   . Hypertension  Brother   . Stroke Maternal Grandmother   . Heart disease Maternal Grandfather   . Heart disease Paternal Grandmother   . Breast cancer Neg Hx   . Colon cancer Neg Hx   . Ovarian cancer Neg Hx   . Endometrial cancer Neg Hx   . Pancreatic cancer Neg Hx   . Prostate cancer Neg Hx      SOCIAL HISTORY:  Social Connections: Not on file    REVIEW OF SYSTEMS:  Pertinent positives include urinary frequency, pain with urination, pelvic  pain, anxiety Denies appetite changes, fevers, chills, fatigue, unexplained weight changes. Denies hearing loss, neck lumps or masses, mouth sores, ringing in ears or voice changes. Denies cough or wheezing.  Denies shortness of breath. Denies chest pain or palpitations. Denies leg swelling. Denies abdominal distention, pain, blood in stools, constipation, diarrhea, nausea, vomiting, or early satiety. Denies pain with intercourse, hematuria or incontinence. Denies hot flashes, vaginal bleeding or vaginal discharge.   Denies joint pain, back pain or muscle pain/cramps. Denies itching, rash, or wounds. Denies dizziness, headaches, numbness or seizures. Denies swollen lymph nodes or glands, denies easy bruising or bleeding. Denies depression, confusion, or decreased concentration.  Physical Exam:  Vital Signs for this encounter:  Blood pressure (!) 138/93, pulse 91, temperature 97.9 F (36.6 C), temperature source Tympanic, resp. rate 18, height 4\' 11"  (1.499 m), weight 211 lb 3.2 oz (95.8 kg), SpO2 97 %. Body mass index is 42.66 kg/m. General: Alert, oriented, no acute distress.  HEENT: Normocephalic, atraumatic. Sclera anicteric.  Chest: Clear to auscultation bilaterally. No wheezes, rhonchi, or rales. Cardiovascular: Regular rate and rhythm, no murmurs, rubs, or gallops.  Abdomen: Obese. Normoactive bowel sounds. Soft, nondistended, nontender to palpation. No masses or hepatosplenomegaly appreciated. No palpable fluid wave.  Extremities: Grossly normal  range of motion. Warm, well perfused. No edema bilaterally.  Skin: No rashes or lesions.  Lymphatics: No cervical, supraclavicular, or inguinal adenopathy.  GU:  Normal external female genitalia. No lesions. No discharge or bleeding.             Bladder/urethra:  No lesions or masses, well supported bladder             Vagina: Well rugated, no lesions or masses.             Cervix: Surgically absent.             Uterus: Surgically absent.             Adnexa: No masses appreciated.  No nodularity within the cul-de-sac.  Rectal: No nodularity or masses appreciated on rectovaginal exam.  LABORATORY AND RADIOLOGIC DATA:  Outside medical records were reviewed to synthesize the above history, along with the history and physical obtained during the visit.   Lab Results  Component Value Date   WBC 5.0 09/26/2020   HGB 16.0 (H) 09/26/2020   HCT 48.9 (H) 09/26/2020   PLT 204 09/26/2020   GLUCOSE 109 (H) 09/26/2020   CHOL 136 01/08/2012   TRIG 63 01/08/2012   HDL 49 01/08/2012   LDLCALC 74 01/08/2012   ALT 37 (H) 01/14/2013   AST 24 01/14/2013   NA 139 09/26/2020   K 4.0 09/26/2020   CL 101 09/26/2020   CREATININE 0.78 09/26/2020   BUN 17 09/26/2020   CO2 29 09/26/2020   TSH 0.59 01/08/2012   INR 1.0 05/29/2009   Pelvic ultrasound 09/25/20: 1. Technically limited nearly nondiagnostic exam due to habitus and shadowing from overlying bowel gas. 2. 3.5 cm complex left adnexal cystic structure, corresponding with abnormality on prior CT. Finding is indeterminate, but could reflect a cystic ovarian neoplasm. Gynecologic referral for further workup and surgical consultation recommended. Additionally, further assessment with dedicated pelvic MRI, with and without contrast, suggested for further characterization, as this lesion is not well visualized or characterized on this limited exam. 3. Approximate 5.9 cm hypoechoic structure within the right adnexa delineated by the ultrasound  technologist. Finding is of uncertain etiology, but favored to reflect a portion of a loop of bowel  or other normal structure, as no discrete mass or other finding is seen within this region on prior CT. This could also be assessed at follow-up MRI. 4. Status post hysterectomy.  CT renal stone study 08/21/20: 1. There is a 7 x 5 x 5 mm proximal left ureteral calculus at the L4 level causing moderate hydronephrosis and proximal ureterectasis on the left. Left kidney is subtly edematous. 2.  Nonobstructing 2 mm calculus upper pole left kidney. 3. Mildly complex appearing left predominantly cystic adnexal mass measuring 5.1 x 2.6 cm. Advise correlation with nonemergent pelvic ultrasound to more precisely evaluate the internal architecture of this lesion. Uterus absent. 4. No bowel obstruction. No abscess in the abdomen or pelvis. Appendix appears normal. 5.  Gallbladder absent.

## 2020-10-17 ENCOUNTER — Ambulatory Visit (HOSPITAL_COMMUNITY)
Admission: RE | Admit: 2020-10-17 | Discharge: 2020-10-17 | Disposition: A | Discharge status: Home / Self Care | Payer: BC Managed Care – PPO | Source: Ambulatory Visit | Attending: Gynecologic Oncology | Admitting: Gynecologic Oncology

## 2020-10-17 ENCOUNTER — Other Ambulatory Visit: Payer: Self-pay

## 2020-10-17 DIAGNOSIS — N9489 Other specified conditions associated with female genital organs and menstrual cycle: Secondary | ICD-10-CM | POA: Insufficient documentation

## 2020-10-17 MED ORDER — GADOBUTROL 1 MMOL/ML IV SOLN
9.0000 mL | Freq: Once | INTRAVENOUS | Status: AC | PRN
  Administered 2020-10-17: 9 mL via INTRAVENOUS

## 2020-10-18 ENCOUNTER — Encounter: Payer: Self-pay | Admitting: Gynecologic Oncology

## 2020-10-18 ENCOUNTER — Telehealth: Payer: Self-pay | Admitting: Oncology

## 2020-10-18 ENCOUNTER — Inpatient Hospital Stay: Payer: BC Managed Care – PPO | Attending: Gynecologic Oncology | Admitting: Gynecologic Oncology

## 2020-10-18 DIAGNOSIS — N9489 Other specified conditions associated with female genital organs and menstrual cycle: Secondary | ICD-10-CM

## 2020-10-18 NOTE — Progress Notes (Addendum)
Gynecologic Oncology Telehealth Consult Note: Gyn-Onc  I connected with Victoria Craig on 10/18/20 at  3:00 PM EST by telephone and verified that I am speaking with the correct person using two identifiers.  I discussed the limitations, risks, security and privacy concerns of performing an evaluation and management service by telemedicine and the availability of in-person appointments. I also discussed with the patient that there may be a patient responsible charge related to this service. The patient expressed understanding and agreed to proceed.  Other persons participating in the visit and their role in the encounter: None.  Patient's location: Home Provider's location: [facility]  Reason for Visit: Follow-up regarding recent MRI results  Treatment History: 1981: total hysterectomy and unilateral versus bilateral salpingo-oophorectomy for history of fibroids and ovarian cyst.   CT scan in January when the patient presented with abdominal pain and hematuria (noted to have a kidney stone) showed a mildly complex left cystic adnexal mass measuring 5.1 x 2.6 cm.  Interval History: Patient reports doing well.  She has continued to have some left lower quadrant pain as well as pain radiating down her leg.  Overall she notes that this has decreased in severity and frequency.  Tolerating a diet without nausea or emesis.   Past Medical/Surgical History: Past Medical History:  Diagnosis Date  . Acute maxillary sinusitis   . Aphthous ulcer   . Arthritis   . Depression   . Essential hypertension, malignant   . GERD (gastroesophageal reflux disease)   . History of kidney stones   . Lateral meniscal tear    right knee  . Mitral valve prolapse    normal ECHO 12-01-12, EF 60%  . Obesity, unspecified   . Pure hypercholesterolemia   . Sleep disturbance     Past Surgical History:  Procedure Laterality Date  . ABDOMINAL HYSTERECTOMY  1986  . BREAST BIOPSY Left 06/2018  .  CESAREAN SECTION  77,81  . CHOLECYSTECTOMY  2011  . COLONOSCOPY    . CYSTOSCOPY/URETEROSCOPY/HOLMIUM LASER/STENT PLACEMENT Left 10/01/2020   Procedure: CYSTOSCOPY/LEFT RETROGRADE/URETEROSCOPY/HOLMIUM LASER/STENT PLACEMENT;  Surgeon: Raynelle Bring, MD;  Location: [facility];  Service: Urology;  Laterality: Left;  . DISTAL BICEPS TENDON REPAIR Right 04/15/2013   Procedure: DISTAL BICEPS TENDON REPAIR;  Surgeon: Marybelle Killings, MD;  Location: [place];  Service: Orthopedics;  Laterality: Right;  Right Distal Biceps Tendon Repair  . H/O Event monitor  2014   Palpitations  . KNEE ARTHROSCOPY Right 04/06/2015   Procedure: Right Knee Arthroscopy, Partial Lateral Menisectomy, Debridement Parameniscal Cyst;  Surgeon: Marybelle Killings, MD;  Location: [place];  Service: Orthopedics;  Laterality: Right;  . KNEE ARTHROSCOPY WITH LATERAL MENISECTOMY Right 04/06/2015   Procedure: KNEE ARTHROSCOPY WITH PARTIAL LATERAL MENISECTOMY;  Surgeon: Marybelle Killings, MD;  Location: [place];  Service: Orthopedics;  Laterality: Right;  . LYSIS OF ADHESION     reports having surgery after her hysterectomy for scar tissue and related pain  . TRIGGER FINGER RELEASE Right 05/08/2014   Procedure: Excision Right Palm Mass, Trigger Finger Release Right Ring Finger;  Surgeon: Marybelle Killings, MD;  Location: [place];  Service: Orthopedics;  Laterality: Right;  . TUBAL LIGATION      Family History  Problem Relation Age of Onset  . Heart disease Mother   . Heart disease Father   . Hypertension Brother   . Stroke Maternal Grandmother   . Heart disease Maternal Grandfather   .  Heart disease Paternal Grandmother   . Breast cancer Neg Hx   . Colon cancer Neg Hx   . Ovarian cancer Neg Hx   . Endometrial cancer Neg Hx   . Pancreatic cancer Neg Hx   . Prostate cancer Neg Hx     Social History   Socioeconomic History  . Marital status: Married    Spouse name: Not on file  . Number of  children: Not on file  . Years of education: Not on file  . Highest education level: Not on file  Occupational History  . Occupation: retired  Tobacco Use  . Smoking status: Never Smoker  . Smokeless tobacco: Never Used  Vaping Use  . Vaping Use: Never used  Substance and Sexual Activity  . Alcohol use: Not Currently  . Drug use: No  . Sexual activity: Not Currently  Other Topics Concern  . Not on file  Social History Narrative  . Not on file   Social Determinants of Health   Financial Resource Strain: Not on file  Food Insecurity: Not on file  Transportation Needs: Not on file  Physical Activity: Not on file  Stress: Not on file  Social Connections: Not on file    Current Medications:  Current Outpatient Medications:  .  acetaminophen (TYLENOL) 500 MG tablet, Take 1,000 mg by mouth every 6 (six) hours as needed for moderate pain., Disp: , Rfl:  .  cholecalciferol (VITAMIN D3) 25 MCG (1000 UNIT) tablet, Take 1,000 Units by mouth daily., Disp: , Rfl:  .  diltiazem (CARDIZEM CD) 120 MG 24 hr capsule, Take 120 mg by mouth daily., Disp: , Rfl:  .  estradiol (VIVELLE-DOT) 0.1 MG/24HR, Place 1 patch onto the skin 2 (two) times a week. New patch on Tuesdays and Thursdays, Disp: , Rfl:  .  LORazepam (ATIVAN) 0.5 MG tablet, Take 0.5 mg by mouth 2 (two) times daily as needed for anxiety., Disp: , Rfl: 2 .  metoprolol succinate (TOPROL-XL) 25 MG 24 hr tablet, Take 25 mg by mouth 2 (two) times daily., Disp: , Rfl:  .  Multiple Vitamin (MULTIVITAMIN WITH MINERALS) TABS tablet, Take 1 tablet by mouth daily., Disp: , Rfl:  .  omeprazole (PRILOSEC) 20 MG capsule, TAKE 1 CAPSULE BY MOUTH TWICE A DAY BEFORE A MEAL (PLEASE MAKE APPOINTMENT) (Patient taking differently: Take 20 mg by mouth daily.), Disp: 30 capsule, Rfl: 0 .  ondansetron (ZOFRAN ODT) 4 MG disintegrating tablet, 4mg  ODT q4 hours prn nausea/vomit (Patient taking differently: Take 4 mg by mouth every 4 (four) hours as needed for  vomiting or nausea.), Disp: 12 tablet, Rfl: 0 .  oxyCODONE-acetaminophen (PERCOCET) 5-325 MG tablet, Take 1 tablet by mouth every 4 (four) hours as needed for severe pain., Disp: 8 tablet, Rfl: 0 .  oxyCODONE-acetaminophen (PERCOCET/ROXICET) 5-325 MG tablet, Take 1-2 tablets by mouth every 6 (six) hours as needed for pain., Disp: , Rfl:  .  tamsulosin (FLOMAX) 0.4 MG CAPS capsule, Take 0.4 mg by mouth at bedtime., Disp: , Rfl:  .  triamterene-hydrochlorothiazide (DYAZIDE) 37.5-25 MG per capsule, Take 1 capsule by mouth daily., Disp: , Rfl:   Review of Symptoms: Pertinent positives as per HPI.  Physical Exam: There were no vitals taken for this visit. Deferred given limitations of phone visit.  Laboratory & Radiologic Studies: MRI pelvis 2/9: Uterus: Status posthysterectomy. No mass or fluid collection at the hysterectomy margin.  Ovaries and Adnexa: Nonvisualization of the right ovary. No right adnexal masses.  There is a  thin-walled tubular cystic mass in the left adnexa measuring 5.1 x 2.5 x 5.3 cm (series 4/image 23 and image 5/image 14) demonstrating thin eccentric incomplete internal septations, with no enhancing solid mural nodularity or thickened septations. No identifiable left ovarian parenchymal tissue. This mass is unchanged from recent 08/21/2020 CT.  Other: No abnormal free fluid in the pelvis. No focal pelvic fluid collection.  Musculoskeletal: No aggressive appearing focal osseous lesions.  IMPRESSION: 1. Thin-walled tubular cystic mass in the left adnexa measuring 5.1 x 2.5 x 5.3 cm, with thin eccentric incomplete internal septations and no enhancing solid mural nodularity, stable from recent 08/21/2020 CT. No identifiable ovarian tissue on either side. Morphologically, a dilated left tubal remnant is favored. This cystic mass was not well visualized by ultrasound. As such, initial follow-up MRI pelvis without and with IV contrast is suggested in 6-12  months. 2. No pelvic adenopathy.  No free fluid. 3. No mass or fluid collection at the hysterectomy margin.  Assessment & Plan: TISH BEGIN is a 65 y.o. woman with a remote history of hysterectomy and likely BSO with some abdominal and pelvic pain in the setting of recent kidney stone with MRI imaging now likely showing tubal remnant on the left with a hydrosalpinx.  I discussed in detail MRI findings with the patient.  She continues to have some abdominal pain but overall this has improved.  Based on the appearance of her adnexal mass, I suspect that she had a remnant of her fallopian tube on the left that was left in situ and has developed a hydrosalpinx.  There do not appear to be concerning features of this adnexal structure.  Given its size, I think it is unlikely to be causing her symptoms.  She and I had previously discussed at her clinic visit, in the setting of benign features on MRI, I do not recommend proceeding with surgery at this time.  I recommend repeat MRI of the pelvis in approximately 6 months.  If no significant change in the appearance or size of this lesion, then I do not think that continued imaging surveillance is necessary.  If the patient were to develop worsening or new symptoms, then imaging could be done sooner.  I have let her know that I will send my note from today to Dr. Brien Mates for follow-up, but that I am available for any future needs or to review the imaging if concerning findings are noted on her follow-up MRI.  I discussed the assessment and treatment plan with the patient. The patient was provided with an opportunity to ask questions and all were answered. The patient agreed with the plan and demonstrated an understanding of the instructions.   The patient was advised to call back or see an in-person evaluation if the symptoms worsen or if the condition fails to improve as anticipated.   22 minutes of total time was spent for this patient encounter,  including preparation, face-to-face counseling with the patient and coordination of care, and documentation of the encounter.   Jeral Pinch, MD  Division of Gynecologic Oncology  Department of Obstetrics and Gynecology  [facility]

## 2020-10-18 NOTE — Telephone Encounter (Signed)
Left a message for Dr. Donnamarie Poag nurse advising that Dr. Berline Lopes is recommending a repeat MRI and follow up with Dr. Brien Mates in 6 months.  Requested a return call if they have any questions.

## 2020-10-19 ENCOUNTER — Other Ambulatory Visit (HOSPITAL_COMMUNITY): Payer: Self-pay | Admitting: Obstetrics and Gynecology

## 2020-10-19 DIAGNOSIS — R1900 Intra-abdominal and pelvic swelling, mass and lump, unspecified site: Secondary | ICD-10-CM

## 2020-11-12 ENCOUNTER — Telehealth: Payer: Self-pay

## 2020-11-12 NOTE — Telephone Encounter (Signed)
Victoria Craig states that she is experiencing increased left sided abdominal pain.  She cannot bend and is experiencing some nausea. She would like to make an appointment with Dr. Berline Lopes to be evaluated. Reviewed with Dr. Berline Lopes. Told Victoria Craig that Dr. Berline Lopes said that given the benign features of the adnexal structure she recommends making an appointment with her OB-Gyn Dr. Lorane Gell to be evaluated.  Pt verbalized understanding.

## 2020-11-13 ENCOUNTER — Telehealth: Payer: Self-pay | Admitting: Gynecologic Oncology

## 2020-11-13 NOTE — Telephone Encounter (Signed)
Attempted to call patient and follow up on call from last pm about increasing abdominal pain. Left message for the patient asking her to please call the office.

## 2020-11-19 ENCOUNTER — Other Ambulatory Visit (HOSPITAL_COMMUNITY): Payer: Self-pay | Admitting: Obstetrics and Gynecology

## 2020-11-19 ENCOUNTER — Encounter (HOSPITAL_COMMUNITY): Payer: Self-pay

## 2020-11-19 DIAGNOSIS — N7011 Chronic salpingitis: Secondary | ICD-10-CM

## 2020-11-19 NOTE — Progress Notes (Signed)
TM    [place] Female, 65 y.o., [birth year]  MRN:  [id number] Phone:  [phone] (H)       PCP:  Rogers Blocker, MD Primary Cvg:  Medicare/Medicare Part A  Next Appt With Radiology ([facility]) 12/07/2020 at 11:00 AM          FW: IR drain placement Received: Today  Message Details  Rickey Barbara, Arlys John,   Would this be for you? I have the order, I'm going to fax it over now.   Thanks,   Ash    Previous Messages  ----- Message -----  From: Arne Cleveland, MD  Sent: 11/19/2020  1:11 PM EDT  To: Danielle Dess  Subject: RE: IR drain placement              Ok   CT aspiration L pelvic cystic process   [facility]     ----- Message -----  From: Danielle Dess  Sent: 11/19/2020 12:31 PM EDT  To: Ir Procedure Requests  Subject: IR drain placement                Procedure: IR fluoro guided needle aspiration   Dx: chronic salpingitis, LLQ pain, h/o hysterectomy/BSO   Note to Provider: Finding of of "thin-walled tubular cystic mass in the left adnexa measuring 5.1 x 2.5 x 5.3 cm most likely fallopian tube remnant". Has been seen by GYN Oncology with no concern for malignancy. She is a poor surgical candidate so I am requesting IR drainage to r/o this cyst as the cause of her pain.    Ordering: Dr. Irene Pap [phone] ([place])   Imaging: MR pelvis 10/17/20 in epic.   Please review.   Thanks,  Lia Foyer

## 2020-12-06 NOTE — Patient Instructions (Signed)
Interventional radiology phone numbers ?[phone] ?After hours [phone] ? ?Needle Biopsy, Care After ?These instructions tell you how to care for yourself after your procedure. Your doctor may also give you more specific instructions. Call your doctor if you have any problems or questions. ?What can I expect after the procedure? ?After the procedure, it is common to have: ?Soreness. ?Bruising. ?Mild pain. ?Follow these instructions at home: ?Return to your normal activities as told by your doctor. Ask your doctor what activities are safe for you. ?Take over-the-counter and prescription medicines only as told by your doctor. ?Wash your hands with soap and water before you change your bandage (dressing). If you cannot use soap and water, use hand sanitizer. ?Follow instructions from your doctor about: ?How to take care of your puncture site. ?When and how to change your bandage. ?When to remove your bandage. ?Check your puncture site every day for signs of infection. Watch for: ?Redness, swelling, or pain. ?Fluid or blood. ?Pus or a bad smell. ?Warmth. ?Do not take baths, swim, or use a hot tub until your doctor approves. You may remove your dressing tomorrow and shower. ?Keep all follow-up visits as told by your doctor. This is important.   ?Contact a doctor if you have: ?A fever. ?Redness, swelling, or pain at the puncture site, and it lasts longer than a few days. ?Fluid, blood, or pus coming from the puncture site. ?Warmth coming from the puncture site. ?Get help right away if: ?You have a lot of bleeding from the puncture site. ?Summary ?After the procedure, it is common to have soreness, bruising, or mild pain at the puncture site. ?Check your puncture site every day for signs of infection, such as redness, swelling, or pain. ?Get help right away if you have severe bleeding from your puncture site. ?This information is not intended to replace advice given to you by your health care provider. Make sure you  discuss any questions you have with your health care provider. ?Document Revised: 02/23/2020 Document Reviewed: 02/23/2020 ?Elsevier Patient Education ? 2021 [employer] ? ? ? ? ?Moderate Conscious Sedation, Adult, Care After ?This sheet gives you information about how to care for yourself after your procedure. Your health care provider may also give you more specific instructions. If you have problems or questions, contact your health care provider. ?What can I expect after the procedure? ?After the procedure, it is common to have: ?Sleepiness for several hours. ?Impaired judgment for several hours. ?Difficulty with balance. ?Vomiting if you eat too soon. ?Follow these instructions at home: ?For the time period you were told by your health care provider: ?Rest. ?Do not participate in activities where you could fall or become injured. ?Do not drive or use machinery. ?Do not drink alcohol. ?Do not take sleeping pills or medicines that cause drowsiness. ?Do not make important decisions or sign legal documents. ?Do not take care of children on your own.  ?  ?  ?Eating and drinking ?Follow the diet recommended by your health care provider. ?Drink enough fluid to keep your urine pale yellow. ?If you vomit: ?Drink water, juice, or soup when you can drink without vomiting. ?Make sure you have little or no nausea before eating solid foods.   ?General instructions ?Take over-the-counter and prescription medicines only as told by your health care provider. ?Have a responsible adult stay with you for the time you are told. It is important to have someone help care for you until you are awake and alert. ?Do not smoke. ?  Keep all follow-up visits as told by your health care provider. This is important. ?Contact a health care provider if: ?You are still sleepy or having trouble with balance after 24 hours. ?You feel light-headed. ?You keep feeling nauseous or you keep vomiting. ?You develop a rash. ?You have a fever. ?You have  redness or swelling around the IV site. ?Get help right away if: ?You have trouble breathing. ?You have new-onset confusion at home. ?Summary ?After the procedure, it is common to feel sleepy, have impaired judgment, or feel nauseous if you eat too soon. ?Rest after you get home. Know the things you should not do after the procedure. ?Follow the diet recommended by your health care provider and drink enough fluid to keep your urine pale yellow. ?Get help right away if you have trouble breathing or new-onset confusion at home. ?This information is not intended to replace advice given to you by your health care provider. Make sure you discuss any questions you have with your health care provider. ?Document Revised: 12/23/2019 Document Reviewed: 07/21/2019 ?Elsevier Patient Education ? 2021 Elsevier Inc[employer]  ?

## 2020-12-07 ENCOUNTER — Encounter (HOSPITAL_COMMUNITY): Payer: Self-pay

## 2020-12-07 ENCOUNTER — Other Ambulatory Visit (HOSPITAL_COMMUNITY): Payer: Self-pay | Admitting: Obstetrics and Gynecology

## 2020-12-07 ENCOUNTER — Ambulatory Visit (HOSPITAL_COMMUNITY)
Admission: RE | Admit: 2020-12-07 | Discharge: 2020-12-07 | Disposition: A | Discharge status: Home / Self Care | Payer: BC Managed Care – PPO | Source: Ambulatory Visit | Attending: Obstetrics and Gynecology | Admitting: Obstetrics and Gynecology

## 2020-12-07 ENCOUNTER — Other Ambulatory Visit: Payer: Self-pay

## 2020-12-07 ENCOUNTER — Other Ambulatory Visit: Payer: Self-pay | Admitting: Radiology

## 2020-12-07 DIAGNOSIS — N7011 Chronic salpingitis: Secondary | ICD-10-CM | POA: Diagnosis present

## 2020-12-07 LAB — PROTIME-INR
INR: 1 (ref 0.8–1.2)
Prothrombin Time: 12.6 seconds (ref 11.4–15.2)

## 2020-12-07 LAB — BASIC METABOLIC PANEL
Anion gap: 7 (ref 5–15)
BUN: 20 mg/dL (ref 8–23)
CO2: 27 mmol/L (ref 22–32)
Calcium: 9.4 mg/dL (ref 8.9–10.3)
Chloride: 105 mmol/L (ref 98–111)
Creatinine, Ser: 0.67 mg/dL (ref 0.44–1.00)
GFR, Estimated: >60 mL/min (ref >60)
Glucose, Bld: 99 mg/dL (ref 70–99)
Potassium: 4.1 mmol/L (ref 3.5–5.1)
Sodium: 139 mmol/L (ref 135–145)

## 2020-12-07 LAB — CBC WITH DIFFERENTIAL/PLATELET
Abs Immature Granulocytes: 0.01 10*3/uL (ref 0.00–0.07)
Basophils Absolute: 0 10*3/uL (ref 0.0–0.1)
Basophils Relative: 1 %
Eosinophils Absolute: 0.1 10*3/uL (ref 0.0–0.5)
Eosinophils Relative: 2 %
HCT: 50.4 % — ABNORMAL HIGH (ref 36.0–46.0)
Hemoglobin: 16.4 g/dL — ABNORMAL HIGH (ref 12.0–15.0)
Immature Granulocytes: 0 %
Lymphocytes Relative: 45 %
Lymphs Abs: 2.1 10*3/uL (ref 0.7–4.0)
MCH: 27.7 pg (ref 26.0–34.0)
MCHC: 32.5 g/dL (ref 30.0–36.0)
MCV: 85.3 fL (ref 80.0–100.0)
Monocytes Absolute: 0.3 10*3/uL (ref 0.1–1.0)
Monocytes Relative: 6 %
Neutro Abs: 2.2 10*3/uL (ref 1.7–7.7)
Neutrophils Relative %: 46 %
Platelets: 201 10*3/uL (ref 150–400)
RBC: 5.91 MIL/uL — ABNORMAL HIGH (ref 3.87–5.11)
RDW: 14.3 % (ref 11.5–15.5)
WBC: 4.8 10*3/uL (ref 4.0–10.5)
nRBC: 0 % (ref 0.0–0.2)

## 2020-12-07 MED ORDER — MIDAZOLAM HCL 2 MG/2ML IJ SOLN
INTRAMUSCULAR | Status: AC | PRN
  Administered 2020-12-07: 1 mg via INTRAVENOUS
  Administered 2020-12-07: 0.5 mg via INTRAVENOUS
  Administered 2020-12-07 (×2): 1 mg via INTRAVENOUS

## 2020-12-07 MED ORDER — NALOXONE HCL 0.4 MG/ML IJ SOLN
INTRAMUSCULAR | Status: AC
  Filled 2020-12-07: qty 1

## 2020-12-07 MED ORDER — MIDAZOLAM HCL 2 MG/2ML IJ SOLN
INTRAMUSCULAR | Status: AC | PRN
  Administered 2020-12-07: 0.5 mg via INTRAVENOUS

## 2020-12-07 MED ORDER — FENTANYL CITRATE (PF) 100 MCG/2ML IJ SOLN
INTRAMUSCULAR | Status: AC | PRN
  Administered 2020-12-07: 50 ug via INTRAVENOUS
  Administered 2020-12-07 (×2): 25 ug via INTRAVENOUS
  Administered 2020-12-07: 50 ug via INTRAVENOUS

## 2020-12-07 MED ORDER — LIDOCAINE HCL 1 % IJ SOLN
5.0000 mL | Freq: Once | INTRAMUSCULAR | Status: AC
  Administered 2020-12-07: 5 mL via INTRADERMAL

## 2020-12-07 MED ORDER — SODIUM CHLORIDE 0.9 % IV SOLN: INTRAVENOUS | Status: DC

## 2020-12-07 MED ORDER — FENTANYL CITRATE (PF) 100 MCG/2ML IJ SOLN
INTRAMUSCULAR | Status: AC
  Filled 2020-12-07: qty 4

## 2020-12-07 MED ORDER — LIDOCAINE HCL (PF) 1 % IJ SOLN
INTRAMUSCULAR | Status: AC | PRN
  Administered 2020-12-07 (×2): 10 mL via INTRADERMAL

## 2020-12-07 MED ORDER — MIDAZOLAM HCL 2 MG/2ML IJ SOLN
INTRAMUSCULAR | Status: AC
  Filled 2020-12-07: qty 4

## 2020-12-07 MED ORDER — FLUMAZENIL 0.5 MG/5ML IV SOLN
INTRAVENOUS | Status: AC
  Filled 2020-12-07: qty 5

## 2020-12-07 MED ORDER — LIDOCAINE HCL 1 % IJ SOLN: 10.0000 mL | Freq: Once | INTRAMUSCULAR | Status: DC

## 2020-12-07 MED ORDER — HYDROCODONE-ACETAMINOPHEN 5-325 MG PO TABS
1.0000 | ORAL_TABLET | ORAL | Status: DC | PRN
  Administered 2020-12-07: 1 via ORAL
  Filled 2020-12-07: qty 1

## 2020-12-07 NOTE — Procedures (Signed)
Interventional Radiology Procedure Note  Procedure: Cyst aspiration  Indication: Left adnexal cyst  Findings: Please refer to procedural dictation for full description.  Complications: None  EBL: < 10 mL  Miachel Roux, MD [phone]

## 2020-12-07 NOTE — H&P (Signed)
Chief Complaint: Patient was seen in consultation today for image guided aspiration and biopsy of cystic mass in the left adnexa at the request of Irene Pap E  Referring Physician(s): Rowland Lathe  Supervising Physician: Mir, Sharen Heck  Patient Status: [facility] - Out-pt  History of Present Illness: Victoria Craig is a 65 y.o. female with past medical history of HTN, GERD, depression, mitral valve prolapse, nephrolithiasis, history of uterine fibrosis and ovarian cyst s/p total hysterectomy and uterine unilateral VS bilateral salpingo-oophorectomy in 1981.  Patient is new to our service. Patient presented to emergency department on 08/21/2020 with chief complaint of abdominal pain and underwent work-up for kidney stone.  The CT scan in the ED showed an incidental finding of a mildly complex adnexal mass, and patient were referred to GYN oncology  and patient underwent MRI for further evaluation and management of the mass. MRI revealed that the mass the adnexa was a cystic mass with thin eccentric incomplete internal septations, with no enhancing solid mural nodularity or thickened septations. After thorough discussion and shared decision making with her GYN oncology, patient decided to undergo aspiration and biopsy in the left adnexa due to persistent LLQ/left groin pain.  IR was requested for aspiration and biopsy of left adnexa mass.   Patient laying in bed, not in acute distress.  Denise headache, fever, chills, shortness of breath, cough, chest pain, abdominal pain, nausea ,vomiting, and bleeding.  Past Medical History:  Diagnosis Date  . Acute maxillary sinusitis   . Aphthous ulcer   . Arthritis   . Depression   . Essential hypertension, malignant   . GERD (gastroesophageal reflux disease)   . History of kidney stones   . Lateral meniscal tear    right knee  . Mitral valve prolapse    normal ECHO 12-01-12, EF 60%  . Obesity, unspecified   . Pure  hypercholesterolemia   . Sleep disturbance     Past Surgical History:  Procedure Laterality Date  . ABDOMINAL HYSTERECTOMY  1986  . BREAST BIOPSY Left 06/2018  . CESAREAN SECTION  77,81  . CHOLECYSTECTOMY  2011  . COLONOSCOPY    . CYSTOSCOPY/URETEROSCOPY/HOLMIUM LASER/STENT PLACEMENT Left 10/01/2020   Procedure: CYSTOSCOPY/LEFT RETROGRADE/URETEROSCOPY/HOLMIUM LASER/STENT PLACEMENT;  Surgeon: Raynelle Bring, MD;  Location: [facility];  Service: Urology;  Laterality: Left;  . DISTAL BICEPS TENDON REPAIR Right 04/15/2013   Procedure: DISTAL BICEPS TENDON REPAIR;  Surgeon: Marybelle Killings, MD;  Location: [place];  Service: Orthopedics;  Laterality: Right;  Right Distal Biceps Tendon Repair  . H/O Event monitor  2014   Palpitations  . KNEE ARTHROSCOPY Right 04/06/2015   Procedure: Right Knee Arthroscopy, Partial Lateral Menisectomy, Debridement Parameniscal Cyst;  Surgeon: Marybelle Killings, MD;  Location: [place];  Service: Orthopedics;  Laterality: Right;  . KNEE ARTHROSCOPY WITH LATERAL MENISECTOMY Right 04/06/2015   Procedure: KNEE ARTHROSCOPY WITH PARTIAL LATERAL MENISECTOMY;  Surgeon: Marybelle Killings, MD;  Location: [place];  Service: Orthopedics;  Laterality: Right;  . LYSIS OF ADHESION     reports having surgery after her hysterectomy for scar tissue and related pain  . TRIGGER FINGER RELEASE Right 05/08/2014   Procedure: Excision Right Palm Mass, Trigger Finger Release Right Ring Finger;  Surgeon: Marybelle Killings, MD;  Location: [place];  Service: Orthopedics;  Laterality: Right;  . TUBAL LIGATION      Allergies: Patient has no known allergies.  Medications: Prior to Admission medications   Medication Sig Start Date  End Date Taking? Authorizing Provider  acetaminophen (TYLENOL) 500 MG tablet Take 1,000 mg by mouth every 6 (six) hours as needed for moderate pain.    [provider]  cholecalciferol (VITAMIN D3) 25 MCG (1000 UNIT) tablet Take  1,000 Units by mouth daily.    [provider]  diltiazem (CARDIZEM CD) 120 MG 24 hr capsule Take 120 mg by mouth daily.    [provider]  estradiol (VIVELLE-DOT) 0.1 MG/24HR Place 1 patch onto the skin 2 (two) times a week. New patch on Tuesdays and Thursdays    [provider]  LORazepam (ATIVAN) 0.5 MG tablet Take 0.5 mg by mouth 2 (two) times daily as needed for anxiety. 05/02/18   [provider]  metoprolol succinate (TOPROL-XL) 25 MG 24 hr tablet Take 25 mg by mouth 2 (two) times daily.    [provider]  Multiple Vitamin (MULTIVITAMIN WITH MINERALS) TABS tablet Take 1 tablet by mouth daily.    [provider]  omeprazole (PRILOSEC) 20 MG capsule TAKE 1 CAPSULE BY MOUTH TWICE A DAY BEFORE A MEAL (PLEASE MAKE APPOINTMENT) Patient taking differently: Take 20 mg by mouth daily. 07/31/14   Lorretta Harp, MD  ondansetron (ZOFRAN ODT) 4 MG disintegrating tablet 4mg  ODT q4 hours prn nausea/vomit Patient taking differently: Take 4 mg by mouth every 4 (four) hours as needed for vomiting or nausea. 08/21/20   Elnora Morrison, MD  oxyCODONE-acetaminophen (PERCOCET) 5-325 MG tablet Take 1 tablet by mouth every 4 (four) hours as needed for severe pain. 10/01/20 10/01/21  Raynelle Bring, MD  oxyCODONE-acetaminophen (PERCOCET/ROXICET) 5-325 MG tablet Take 1-2 tablets by mouth every 6 (six) hours as needed for pain. 09/05/20   [provider]  tamsulosin (FLOMAX) 0.4 MG CAPS capsule Take 0.4 mg by mouth at bedtime. 09/05/20   [provider]  triamterene-hydrochlorothiazide (DYAZIDE) 37.5-25 MG per capsule Take 1 capsule by mouth daily.    [provider]     Family History  Problem Relation Age of Onset  . Heart disease Mother   . Heart disease Father   . Hypertension Brother   . Stroke Maternal Grandmother   . Heart disease Maternal Grandfather   . Heart disease Paternal Grandmother   . Breast cancer Neg Hx   .  Colon cancer Neg Hx   . Ovarian cancer Neg Hx   . Endometrial cancer Neg Hx   . Pancreatic cancer Neg Hx   . Prostate cancer Neg Hx     Social History   Socioeconomic History  . Marital status: Married    Spouse name: Not on file  . Number of children: Not on file  . Years of education: Not on file  . Highest education level: Not on file  Occupational History  . Occupation: retired  Tobacco Use  . Smoking status: Never Smoker  . Smokeless tobacco: Never Used  Vaping Use  . Vaping Use: Never used  Substance and Sexual Activity  . Alcohol use: Not Currently  . Drug use: No  . Sexual activity: Not Currently  Other Topics Concern  . Not on file  Social History Narrative  . Not on file   Social Determinants of Health   Financial Resource Strain: Not on file  Food Insecurity: Not on file  Transportation Needs: Not on file  Physical Activity: Not on file  Stress: Not on file  Social Connections: Not on file     Review of Systems: A 12 point ROS discussed and  pertinent positives are indicated in the HPI above.  All other systems are negative.   Vital Signs: There were no vitals taken for this visit.  Physical Examination   Constitutional:      Appearance: Normal appearance.  Cardiovascular:     Rate and Rhythm: Normal rate and regular rhythm.     Pulses: Normal pulses.     Heart sounds: Normal heart sounds.  Pulmonary:     Effort: Pulmonary effort is normal.     Breath sounds: Normal breath sounds.  Abdominal:     General: Abdomen is flat. Bowel sounds are normal.     Palpations: Abdomen is soft.  Neurological:     Mental Status: Patient is alert and oriented to person, place, and time.    Imaging: No results found.  Labs:  CBC: Recent Labs    08/20/20 2013 09/26/20 0943  WBC 6.1 5.0  HGB 15.6* 16.0*  HCT 49.3* 48.9*  PLT 213 204    COAGS: No results for input(s): INR, APTT in the last 8760 hours.  BMP: Recent Labs    08/20/20 2013  09/26/20 0943  NA 140 139  K 3.7 4.0  CL 104 101  CO2 25 29  GLUCOSE 89 109*  BUN 13 17  CALCIUM 9.8 9.4  CREATININE 0.69 0.78  GFRNONAA >60 >60    LIVER FUNCTION TESTS: No results for input(s): BILITOT, AST, ALT, ALKPHOS, PROT, ALBUMIN in the last 8760 hours.  TUMOR MARKERS: No results for input(s): AFPTM, CEA, CA199, CHROMGRNA in the last 8760 hours.  Assessment and Plan: A cystic mass in the left adnexa. IR was requested for aspiration and biopsy of the cystic mass in the left adnexa. Case was reviewed and approved by Dr. Vernard Gambles for CT-guided aspiration and biopsy of the cystic mass in the left adnexa. Patient presented to IR today for the procedure. Patient states that she had a such a hard time and became very anxious with IV insertion when she had kidney stone surgery, and she had lidocaine injected to her skin before IV insertion which helped a lot with her anxiety.  She is asking if she could have some lidocaine injected to her skin prior to IV  insertion today. Informed patient that the lidocaine will burn and sting, and might cause more pain. Patient verbalizes understanding, and would like to receive lidocaine injection as it will reducer her anxiety about IV insertion.  Lidocaine ordered.   N.p.o. since midnight Not on blood thinner Labs pending  Risks and benefits of a cystic mass in the left adnexa was discussed with the patient and/or patient's family including, but not limited to bleeding, infection, damage to adjacent structures or low yield requiring additional tests.  All of the questions were answered and there is agreement to proceed.  Consent signed and in chart.   Thank you for this interesting consult.  I greatly enjoyed meeting MERIAH SHANDS and look forward to participating in their care.  A copy of this report was sent to the requesting provider on this date.  Electronically Signed: Tera Mater, PA-C 12/07/2020, 9:26 AM   I spent a total  of  30 Minutes   in face to face in clinical consultation, greater than 50% of which was counseling/coordinating care for aspiration and biopsy of a cystic mass in the left adnexa.

## 2020-12-07 NOTE — Discharge Instructions (Signed)
No change to your home medications    Interventional radiology phone numbers [phone] After hours [phone]  Needle Biopsy, Care After These instructions tell you how to care for yourself after your procedure. Your doctor may also give you more specific instructions. Call your doctor if you have any problems or questions. What can I expect after the procedure? After the procedure, it is common to have:  Soreness.  Bruising.  Mild pain. Follow these instructions at home:  Return to your normal activities as told by your doctor. Ask your doctor what activities are safe for you.  Take over-the-counter and prescription medicines only as told by your doctor.  Wash your hands with soap and water before you change your bandage (dressing). If you cannot use soap and water, use hand sanitizer.  Follow instructions from your doctor about: ? How to take care of your puncture site. ? When and how to change your bandage. ? When to remove your bandage.  Check your puncture site every day for signs of infection. Watch for: ? Redness, swelling, or pain. ? Fluid or blood. ? Pus or a bad smell. ? Warmth.  Do not take baths, swim, or use a hot tub until your doctor approves. You may remove your dressing tomorrow and shower.  Keep all follow-up visits as told by your doctor. This is important.   Contact a doctor if you have:  A fever.  Redness, swelling, or pain at the puncture site, and it lasts longer than a few days.  Fluid, blood, or pus coming from the puncture site.  Warmth coming from the puncture site. Get help right away if:  You have a lot of bleeding from the puncture site. Summary  After the procedure, it is common to have soreness, bruising, or mild pain at the puncture site.  Check your puncture site every day for signs of infection, such as redness, swelling, or pain.  Get help right away if you have severe bleeding from your puncture site. This  information is not intended to replace advice given to you by your health care provider. Make sure you discuss any questions you have with your health care provider. Document Revised: 02/23/2020 Document Reviewed: 02/23/2020 Elsevier Patient Education  2021 [employer].     Moderate Conscious Sedation, Adult, Care After This sheet gives you information about how to care for yourself after your procedure. Your health care provider may also give you more specific instructions. If you have problems or questions, contact your health care provider. What can I expect after the procedure? After the procedure, it is common to have:  Sleepiness for several hours.  Impaired judgment for several hours.  Difficulty with balance.  Vomiting if you eat too soon. Follow these instructions at home: For the time period you were told by your health care provider:  Rest.  Do not participate in activities where you could fall or become injured.  Do not drive or use machinery.  Do not drink alcohol.  Do not take sleeping pills or medicines that cause drowsiness.  Do not make important decisions or sign legal documents.  Do not take care of children on your own.      Eating and drinking  Follow the diet recommended by your health care provider.  Drink enough fluid to keep your urine pale yellow.  If you vomit: ? Drink water, juice, or soup when you can drink without vomiting. ? Make sure you have little or no nausea before eating solid  foods.   General instructions  Take over-the-counter and prescription medicines only as told by your health care provider.  Have a responsible adult stay with you for the time you are told. It is important to have someone help care for you until you are awake and alert.  Do not smoke.  Keep all follow-up visits as told by your health care provider. This is important. Contact a health care provider if:  You are still sleepy or having trouble with  balance after 24 hours.  You feel light-headed.  You keep feeling nauseous or you keep vomiting.  You develop a rash.  You have a fever.  You have redness or swelling around the IV site. Get help right away if:  You have trouble breathing.  You have new-onset confusion at home. Summary  After the procedure, it is common to feel sleepy, have impaired judgment, or feel nauseous if you eat too soon.  Rest after you get home. Know the things you should not do after the procedure.  Follow the diet recommended by your health care provider and drink enough fluid to keep your urine pale yellow.  Get help right away if you have trouble breathing or new-onset confusion at home. This information is not intended to replace advice given to you by your health care provider. Make sure you discuss any questions you have with your health care provider. Document Revised: 12/23/2019 Document Reviewed: 07/21/2019 Elsevier Patient Education  2021 [employer].

## 2020-12-12 LAB — AEROBIC/ANAEROBIC CULTURE W GRAM STAIN (SURGICAL/DEEP WOUND): Culture: NO GROWTH

## 2021-01-01 ENCOUNTER — Other Ambulatory Visit: Payer: Self-pay | Admitting: Obstetrics and Gynecology

## 2021-01-01 DIAGNOSIS — R1032 Left lower quadrant pain: Secondary | ICD-10-CM

## 2021-01-08 ENCOUNTER — Ambulatory Visit: Payer: BC Managed Care – PPO | Admitting: Orthopaedic Surgery

## 2021-01-08 ENCOUNTER — Other Ambulatory Visit: Payer: Self-pay

## 2021-01-08 ENCOUNTER — Ambulatory Visit: Payer: Self-pay

## 2021-01-08 ENCOUNTER — Encounter: Payer: Self-pay | Admitting: Orthopaedic Surgery

## 2021-01-08 VITALS — BP 117/84 | HR 94 | Ht 61.5 in | Wt 203.0 lb

## 2021-01-08 DIAGNOSIS — M25561 Pain in right knee: Secondary | ICD-10-CM | POA: Diagnosis not present

## 2021-01-08 NOTE — Progress Notes (Signed)
Office Visit Note   Patient: Victoria Craig           Date of Birth: 10-Feb-1956           MRN: [id number] Visit Date: 01/08/2021              Requested by: Rogers Blocker, MD [address] [place],  [place] [id number] PCP: Rogers Blocker, MD   Assessment & Plan: Visit Diagnoses:  1. Right knee pain, unspecified chronicity     Plan: Patient can take 2 Aleve in the morning 2 in the evening if needed.  She can take this for weeks her symptoms are doing.  If she has persistent problems with her knees she can return.  At this point defer intra-articular injection of her knee.  She will return if she has increased problems.  Follow-Up Instructions: No follow-ups on file.   Orders:  Orders Placed This Encounter  Procedures  . XR KNEE 3 VIEW RIGHT   No orders of the defined types were placed in this encounter.     Procedures: No procedures performed   Clinical Data: No additional findings.   Subjective: Chief Complaint  Patient presents with  . Right Knee - Pain    HPI 65 year old female returns she has had trouble with the right knee where she had previous parameniscal cyst debridement and posterior medial meniscal debridement back in July 2016.  Patient states she was doing a lot of yard work down on her knee some turning twisting and had significant increased pain the next day.  She has been using Tylenol no other anti-inflammatory medication.  She has noticed some mild swelling problems getting from sitting to standing problems with stairs.  Review of Systems previous history of ruptured biceps tendon on the left trigger finger.  Positive for hypertension, overweight all other systems noncontributory to HPI.   Objective: Vital Signs: BP 117/84   Pulse 94   Ht 5' 1.5" (1.562 m)   Wt 203 lb (92.1 kg)   BMI 37.74 kg/m   Physical Exam Constitutional:      Appearance: She is well-developed.  HENT:     Head: Normocephalic.     Right Ear: External  ear normal.     Left Ear: External ear normal.  Eyes:     Pupils: Pupils are equal, round, and reactive to light.  Neck:     Thyroid: No thyromegaly.     Trachea: No tracheal deviation.  Cardiovascular:     Rate and Rhythm: Normal rate.  Pulmonary:     Effort: Pulmonary effort is normal.  Abdominal:     Palpations: Abdomen is soft.  Skin:    General: Skin is warm and dry.  Neurological:     Mental Status: She is alert and oriented to person, place, and time.  Psychiatric:        Behavior: Behavior normal.      Ortho Exam negative logroll the hip she is amatory with minimal limp some problems getting from sitting standing crepitus with knee extension.  Positive patellar grind test.  Collateral ligaments ACL PCL exam is normal.  Negative logroll to the hips.  Distal pulses are 2+ no calf tenderness.  No palpable Baker's cyst.  Specialty Comments:  No specialty comments available.  Imaging: No results found.   PMFS History: Patient Active Problem List   Diagnosis Date Noted  . Adnexal mass 10/18/2020  . Atypical chest pain 06/16/2018  . Essential hypertension 06/16/2018  .  Acute meniscal tear of right knee 04/06/2015  . Trigger finger, right 05/08/2014  . Rupture of left biceps tendon 04/15/2013   Past Medical History:  Diagnosis Date  . Acute maxillary sinusitis   . Aphthous ulcer   . Arthritis   . Depression   . Essential hypertension, malignant   . GERD (gastroesophageal reflux disease)   . History of kidney stones   . Lateral meniscal tear    right knee  . Mitral valve prolapse    normal ECHO 12-01-12, EF 60%  . Obesity, unspecified   . Pure hypercholesterolemia   . Sleep disturbance     Family History  Problem Relation Age of Onset  . Heart disease Mother   . Heart disease Father   . Hypertension Brother   . Stroke Maternal Grandmother   . Heart disease Maternal Grandfather   . Heart disease Paternal Grandmother   . Breast cancer Neg Hx   . Colon  cancer Neg Hx   . Ovarian cancer Neg Hx   . Endometrial cancer Neg Hx   . Pancreatic cancer Neg Hx   . Prostate cancer Neg Hx     Past Surgical History:  Procedure Laterality Date  . ABDOMINAL HYSTERECTOMY  1986  . BREAST BIOPSY Left 06/2018  . CESAREAN SECTION  77,81  . CHOLECYSTECTOMY  2011  . COLONOSCOPY    . CYSTOSCOPY/URETEROSCOPY/HOLMIUM LASER/STENT PLACEMENT Left 10/01/2020   Procedure: CYSTOSCOPY/LEFT RETROGRADE/URETEROSCOPY/HOLMIUM LASER/STENT PLACEMENT;  Surgeon: Raynelle Bring, MD;  [facility];  Service: Urology;  Laterality: Left;  . DISTAL BICEPS TENDON REPAIR Right 04/15/2013   Procedure: DISTAL BICEPS TENDON REPAIR;  Surgeon: Marybelle Killings, MD;  Location: [place];  Service: Orthopedics;  Laterality: Right;  Right Distal Biceps Tendon Repair  . H/O Event monitor  2014   Palpitations  . KNEE ARTHROSCOPY Right 04/06/2015   Procedure: Right Knee Arthroscopy, Partial Lateral Menisectomy, Debridement Parameniscal Cyst;  Surgeon: Marybelle Killings, MD;  Location: [place];  Service: Orthopedics;  Laterality: Right;  . KNEE ARTHROSCOPY WITH LATERAL MENISECTOMY Right 04/06/2015   Procedure: KNEE ARTHROSCOPY WITH PARTIAL LATERAL MENISECTOMY;  Surgeon: Marybelle Killings, MD;  Location: [place];  Service: Orthopedics;  Laterality: Right;  . LYSIS OF ADHESION     reports having surgery after her hysterectomy for scar tissue and related pain  . TRIGGER FINGER RELEASE Right 05/08/2014   Procedure: Excision Right Palm Mass, Trigger Finger Release Right Ring Finger;  Surgeon: Marybelle Killings, MD;  Location: [place];  Service: Orthopedics;  Laterality: Right;  . TUBAL LIGATION     Social History   Occupational History  . Occupation: retired  Tobacco Use  . Smoking status: Never Smoker  . Smokeless tobacco: Never Used  Vaping Use  . Vaping Use: Never used  Substance and Sexual Activity  . Alcohol use: Not Currently  . Drug use: No  . Sexual  activity: Not Currently

## 2021-01-25 ENCOUNTER — Other Ambulatory Visit (HOSPITAL_COMMUNITY): Payer: Self-pay | Admitting: Obstetrics and Gynecology

## 2021-01-25 DIAGNOSIS — R1900 Intra-abdominal and pelvic swelling, mass and lump, unspecified site: Secondary | ICD-10-CM

## 2021-04-18 ENCOUNTER — Ambulatory Visit (HOSPITAL_COMMUNITY): Admission: RE | Admit: 2021-04-18 | Payer: BC Managed Care – PPO | Source: Ambulatory Visit

## 2021-04-18 ENCOUNTER — Encounter (HOSPITAL_COMMUNITY): Payer: Self-pay

## 2021-11-26 ENCOUNTER — Other Ambulatory Visit: Payer: Self-pay

## 2021-11-26 ENCOUNTER — Encounter: Payer: Self-pay | Admitting: Orthopaedic Surgery

## 2021-11-26 ENCOUNTER — Ambulatory Visit: Payer: Self-pay

## 2021-11-26 ENCOUNTER — Ambulatory Visit (INDEPENDENT_AMBULATORY_CARE_PROVIDER_SITE_OTHER): Payer: BC Managed Care – PPO | Admitting: Orthopaedic Surgery

## 2021-11-26 VITALS — BP 133/84 | HR 76 | Ht 59.0 in | Wt 205.0 lb

## 2021-11-26 DIAGNOSIS — M542 Cervicalgia: Secondary | ICD-10-CM | POA: Diagnosis not present
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
## 2022-01-07 ENCOUNTER — Ambulatory Visit: Payer: BC Managed Care – PPO | Admitting: Orthopaedic Surgery

## 2022-04-17 DIAGNOSIS — B309 Viral conjunctivitis, unspecified: Secondary | ICD-10-CM | POA: Diagnosis not present

## 2022-05-01 DIAGNOSIS — I160 Hypertensive urgency: Secondary | ICD-10-CM | POA: Diagnosis not present

## 2022-05-01 DIAGNOSIS — J3489 Other specified disorders of nose and nasal sinuses: Secondary | ICD-10-CM | POA: Diagnosis not present

## 2022-05-01 DIAGNOSIS — G4459 Other complicated headache syndrome: Secondary | ICD-10-CM | POA: Diagnosis not present

## 2022-05-01 DIAGNOSIS — E782 Mixed hyperlipidemia: Secondary | ICD-10-CM | POA: Diagnosis not present

## 2022-05-01 DIAGNOSIS — J0110 Acute frontal sinusitis, unspecified: Secondary | ICD-10-CM | POA: Diagnosis not present

## 2022-05-04 ENCOUNTER — Other Ambulatory Visit: Payer: Self-pay

## 2022-05-04 ENCOUNTER — Emergency Department (HOSPITAL_COMMUNITY): Payer: BC Managed Care – PPO

## 2022-05-04 ENCOUNTER — Emergency Department (HOSPITAL_COMMUNITY)
Admission: EM | Admit: 2022-05-04 | Discharge: 2022-05-04 | Disposition: A | Discharge status: Home / Self Care | Payer: BC Managed Care – PPO | Attending: Emergency Medicine | Admitting: Emergency Medicine

## 2022-05-04 ENCOUNTER — Encounter (HOSPITAL_COMMUNITY): Payer: Self-pay | Admitting: Emergency Medicine

## 2022-05-04 DIAGNOSIS — Z79899 Other long term (current) drug therapy: Secondary | ICD-10-CM | POA: Diagnosis not present

## 2022-05-04 DIAGNOSIS — R519 Headache, unspecified: Secondary | ICD-10-CM | POA: Diagnosis not present

## 2022-05-04 DIAGNOSIS — Q7649 Other congenital malformations of spine, not associated with scoliosis: Secondary | ICD-10-CM | POA: Diagnosis not present

## 2022-05-04 DIAGNOSIS — Z90710 Acquired absence of both cervix and uterus: Secondary | ICD-10-CM | POA: Diagnosis not present

## 2022-05-04 DIAGNOSIS — R109 Unspecified abdominal pain: Secondary | ICD-10-CM | POA: Insufficient documentation

## 2022-05-04 DIAGNOSIS — S3991XA Unspecified injury of abdomen, initial encounter: Secondary | ICD-10-CM | POA: Diagnosis not present

## 2022-05-04 DIAGNOSIS — N281 Cyst of kidney, acquired: Secondary | ICD-10-CM | POA: Diagnosis not present

## 2022-05-04 DIAGNOSIS — S0990XA Unspecified injury of head, initial encounter: Secondary | ICD-10-CM | POA: Diagnosis not present

## 2022-05-04 DIAGNOSIS — M25511 Pain in right shoulder: Secondary | ICD-10-CM | POA: Diagnosis not present

## 2022-05-04 DIAGNOSIS — S199XXA Unspecified injury of neck, initial encounter: Secondary | ICD-10-CM | POA: Diagnosis not present

## 2022-05-04 DIAGNOSIS — Y92410 Unspecified street and highway as the place of occurrence of the external cause: Secondary | ICD-10-CM | POA: Insufficient documentation

## 2022-05-04 DIAGNOSIS — R1032 Left lower quadrant pain: Secondary | ICD-10-CM | POA: Diagnosis not present

## 2022-05-04 DIAGNOSIS — M542 Cervicalgia: Secondary | ICD-10-CM | POA: Insufficient documentation

## 2022-05-04 DIAGNOSIS — R079 Chest pain, unspecified: Secondary | ICD-10-CM | POA: Diagnosis not present

## 2022-05-04 DIAGNOSIS — Q283 Other malformations of cerebral vessels: Secondary | ICD-10-CM | POA: Diagnosis not present

## 2022-05-04 DIAGNOSIS — N2889 Other specified disorders of kidney and ureter: Secondary | ICD-10-CM | POA: Diagnosis not present

## 2022-05-04 LAB — BASIC METABOLIC PANEL
Anion gap: 11 (ref 5–15)
BUN: 29 mg/dL — ABNORMAL HIGH (ref 8–23)
CO2: 25 mmol/L (ref 22–32)
Calcium: 10.1 mg/dL (ref 8.9–10.3)
Chloride: 102 mmol/L (ref 98–111)
Creatinine, Ser: 0.99 mg/dL (ref 0.44–1.00)
GFR, Estimated: >60 mL/min (ref >60)
Glucose, Bld: 114 mg/dL — ABNORMAL HIGH (ref 70–99)
Potassium: 4.4 mmol/L (ref 3.5–5.1)
Sodium: 138 mmol/L (ref 135–145)

## 2022-05-04 LAB — CBC WITH DIFFERENTIAL/PLATELET
Abs Immature Granulocytes: 0.03 10*3/uL (ref 0.00–0.07)
Basophils Absolute: 0.1 10*3/uL (ref 0.0–0.1)
Basophils Relative: 1 %
Eosinophils Absolute: 0.2 10*3/uL (ref 0.0–0.5)
Eosinophils Relative: 3 %
HCT: 52.1 % — ABNORMAL HIGH (ref 36.0–46.0)
Hemoglobin: 17.3 g/dL — ABNORMAL HIGH (ref 12.0–15.0)
Immature Granulocytes: 1 %
Lymphocytes Relative: 31 %
Lymphs Abs: 1.9 10*3/uL (ref 0.7–4.0)
MCH: 27.9 pg (ref 26.0–34.0)
MCHC: 33.2 g/dL (ref 30.0–36.0)
MCV: 84 fL (ref 80.0–100.0)
Monocytes Absolute: 0.6 10*3/uL (ref 0.1–1.0)
Monocytes Relative: 9 %
Neutro Abs: 3.4 10*3/uL (ref 1.7–7.7)
Neutrophils Relative %: 55 %
Platelets: 258 10*3/uL (ref 150–400)
RBC: 6.2 MIL/uL — ABNORMAL HIGH (ref 3.87–5.11)
RDW: 14.5 % (ref 11.5–15.5)
WBC: 6.1 10*3/uL (ref 4.0–10.5)
nRBC: 0 % (ref 0.0–0.2)

## 2022-05-04 MED ORDER — IOHEXOL 300 MG/ML  SOLN
100.0000 mL | Freq: Once | INTRAMUSCULAR | Status: AC | PRN
  Administered 2022-05-04: 100 mL via INTRAVENOUS

## 2022-05-04 MED ORDER — ACETAMINOPHEN 500 MG PO TABS: 1000.0000 mg | ORAL_TABLET | Freq: Once | ORAL | Status: DC
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
## 2022-05-09 DIAGNOSIS — N390 Urinary tract infection, site not specified: Secondary | ICD-10-CM | POA: Diagnosis not present

## 2022-05-09 DIAGNOSIS — I10 Essential (primary) hypertension: Secondary | ICD-10-CM | POA: Diagnosis not present

## 2022-05-09 DIAGNOSIS — M542 Cervicalgia: Secondary | ICD-10-CM | POA: Diagnosis not present

## 2022-05-09 DIAGNOSIS — R000 Tachycardia, unspecified: Secondary | ICD-10-CM | POA: Diagnosis not present

## 2022-05-13 DIAGNOSIS — I10 Essential (primary) hypertension: Secondary | ICD-10-CM | POA: Diagnosis not present

## 2022-05-13 DIAGNOSIS — E782 Mixed hyperlipidemia: Secondary | ICD-10-CM | POA: Diagnosis not present

## 2022-05-13 DIAGNOSIS — R7303 Prediabetes: Secondary | ICD-10-CM | POA: Diagnosis not present

## 2022-05-13 DIAGNOSIS — M545 Low back pain, unspecified: Secondary | ICD-10-CM | POA: Diagnosis not present

## 2022-05-13 DIAGNOSIS — N390 Urinary tract infection, site not specified: Secondary | ICD-10-CM | POA: Diagnosis not present

## 2022-05-13 DIAGNOSIS — M542 Cervicalgia: Secondary | ICD-10-CM | POA: Diagnosis not present

## 2022-05-29 DIAGNOSIS — R1084 Generalized abdominal pain: Secondary | ICD-10-CM | POA: Diagnosis not present

## 2022-06-03 ENCOUNTER — Ambulatory Visit: Payer: BC Managed Care – PPO | Admitting: Orthopaedic Surgery

## 2022-07-07 ENCOUNTER — Ambulatory Visit: Payer: BC Managed Care – PPO | Admitting: Surgery

## 2022-07-07 ENCOUNTER — Encounter: Payer: Self-pay | Admitting: Surgery

## 2022-07-07 VITALS — BP 144/89 | HR 78 | Ht 60.0 in | Wt 185.0 lb

## 2022-07-07 DIAGNOSIS — M542 Cervicalgia: Secondary | ICD-10-CM

## 2022-07-07 MED ORDER — METHYLPREDNISOLONE 4 MG PO TABS: ORAL_TABLET | ORAL | 0 refills | Status: DC

## 2022-07-07 MED ORDER — METHOCARBAMOL 500 MG PO TABS: 500.0000 mg | ORAL_TABLET | Freq: Two times a day (BID) | ORAL | 0 refills | Status: DC | PRN
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
## 2022-07-19 DIAGNOSIS — S61011A Laceration without foreign body of right thumb without damage to nail, initial encounter: Secondary | ICD-10-CM | POA: Diagnosis not present

## 2022-07-19 DIAGNOSIS — M79644 Pain in right finger(s): Secondary | ICD-10-CM | POA: Diagnosis not present

## 2022-07-19 DIAGNOSIS — S61051A Open bite of right thumb without damage to nail, initial encounter: Secondary | ICD-10-CM | POA: Diagnosis not present

## 2022-07-19 DIAGNOSIS — M79641 Pain in right hand: Secondary | ICD-10-CM | POA: Diagnosis not present

## 2022-07-20 ENCOUNTER — Ambulatory Visit
Admission: RE | Admit: 2022-07-20 | Discharge: 2022-07-20 | Disposition: A | Discharge status: Home / Self Care | Payer: BC Managed Care – PPO | Source: Ambulatory Visit | Attending: Surgery | Admitting: Surgery

## 2022-07-20 DIAGNOSIS — M542 Cervicalgia: Secondary | ICD-10-CM

## 2022-07-25 ENCOUNTER — Telehealth: Payer: Self-pay | Admitting: Surgery

## 2022-07-25 NOTE — Telephone Encounter (Signed)
07/07/22 ov note faxed to Dr.Eric Marlou Sa [phone]

## 2022-07-29 ENCOUNTER — Ambulatory Visit: Payer: BC Managed Care – PPO | Admitting: Orthopaedic Surgery

## 2022-08-04 DIAGNOSIS — E782 Mixed hyperlipidemia: Secondary | ICD-10-CM | POA: Diagnosis not present

## 2022-08-04 DIAGNOSIS — I10 Essential (primary) hypertension: Secondary | ICD-10-CM | POA: Diagnosis not present

## 2022-08-04 DIAGNOSIS — Z6838 Body mass index (BMI) 38.0-38.9, adult: Secondary | ICD-10-CM | POA: Diagnosis not present

## 2022-08-04 DIAGNOSIS — M542 Cervicalgia: Secondary | ICD-10-CM | POA: Diagnosis not present

## 2022-11-04 DIAGNOSIS — M542 Cervicalgia: Secondary | ICD-10-CM | POA: Diagnosis not present

## 2022-11-04 DIAGNOSIS — R1032 Left lower quadrant pain: Secondary | ICD-10-CM | POA: Diagnosis not present

## 2022-11-04 DIAGNOSIS — R000 Tachycardia, unspecified: Secondary | ICD-10-CM | POA: Diagnosis not present

## 2022-11-04 DIAGNOSIS — I10 Essential (primary) hypertension: Secondary | ICD-10-CM | POA: Diagnosis not present

## 2022-11-04 DIAGNOSIS — Z6838 Body mass index (BMI) 38.0-38.9, adult: Secondary | ICD-10-CM | POA: Diagnosis not present

## 2022-11-04 DIAGNOSIS — E782 Mixed hyperlipidemia: Secondary | ICD-10-CM | POA: Diagnosis not present

## 2022-11-07 DIAGNOSIS — R195 Other fecal abnormalities: Secondary | ICD-10-CM | POA: Diagnosis not present

## 2022-11-07 DIAGNOSIS — K5730 Diverticulosis of large intestine without perforation or abscess without bleeding: Secondary | ICD-10-CM | POA: Diagnosis not present

## 2022-11-17 DIAGNOSIS — Z6838 Body mass index (BMI) 38.0-38.9, adult: Secondary | ICD-10-CM | POA: Diagnosis not present

## 2022-11-17 DIAGNOSIS — I10 Essential (primary) hypertension: Secondary | ICD-10-CM | POA: Diagnosis not present

## 2022-11-17 DIAGNOSIS — K5901 Slow transit constipation: Secondary | ICD-10-CM | POA: Diagnosis not present

## 2022-11-17 DIAGNOSIS — R1032 Left lower quadrant pain: Secondary | ICD-10-CM | POA: Diagnosis not present

## 2023-04-10 DIAGNOSIS — M545 Low back pain, unspecified: Secondary | ICD-10-CM | POA: Diagnosis not present

## 2023-04-10 DIAGNOSIS — E860 Dehydration: Secondary | ICD-10-CM | POA: Diagnosis not present

## 2023-04-10 DIAGNOSIS — J029 Acute pharyngitis, unspecified: Secondary | ICD-10-CM | POA: Diagnosis not present

## 2023-04-10 DIAGNOSIS — R000 Tachycardia, unspecified: Secondary | ICD-10-CM | POA: Diagnosis not present

## 2023-04-10 DIAGNOSIS — H65192 Other acute nonsuppurative otitis media, left ear: Secondary | ICD-10-CM | POA: Diagnosis not present

## 2023-04-10 DIAGNOSIS — Z1152 Encounter for screening for COVID-19: Secondary | ICD-10-CM | POA: Diagnosis not present

## 2023-04-10 DIAGNOSIS — E782 Mixed hyperlipidemia: Secondary | ICD-10-CM | POA: Diagnosis not present

## 2023-04-10 DIAGNOSIS — R1032 Left lower quadrant pain: Secondary | ICD-10-CM | POA: Diagnosis not present

## 2023-04-10 DIAGNOSIS — Z6837 Body mass index (BMI) 37.0-37.9, adult: Secondary | ICD-10-CM | POA: Diagnosis not present

## 2023-04-10 DIAGNOSIS — R0981 Nasal congestion: Secondary | ICD-10-CM | POA: Diagnosis not present

## 2023-04-10 DIAGNOSIS — R0602 Shortness of breath: Secondary | ICD-10-CM | POA: Diagnosis not present

## 2023-04-10 DIAGNOSIS — I119 Hypertensive heart disease without heart failure: Secondary | ICD-10-CM | POA: Diagnosis not present

## 2023-04-30 DIAGNOSIS — G4489 Other headache syndrome: Secondary | ICD-10-CM | POA: Diagnosis not present

## 2023-04-30 DIAGNOSIS — I119 Hypertensive heart disease without heart failure: Secondary | ICD-10-CM | POA: Diagnosis not present

## 2023-04-30 DIAGNOSIS — Z6837 Body mass index (BMI) 37.0-37.9, adult: Secondary | ICD-10-CM | POA: Diagnosis not present

## 2023-04-30 DIAGNOSIS — K1230 Oral mucositis (ulcerative), unspecified: Secondary | ICD-10-CM | POA: Diagnosis not present

## 2023-06-01 DIAGNOSIS — M5481 Occipital neuralgia: Secondary | ICD-10-CM | POA: Diagnosis not present

## 2023-06-01 DIAGNOSIS — M792 Neuralgia and neuritis, unspecified: Secondary | ICD-10-CM | POA: Diagnosis not present

## 2023-06-11 DIAGNOSIS — N130 Hydronephrosis with ureteropelvic junction obstruction: Secondary | ICD-10-CM | POA: Diagnosis not present

## 2023-06-11 DIAGNOSIS — N201 Calculus of ureter: Secondary | ICD-10-CM | POA: Diagnosis not present

## 2023-06-25 DIAGNOSIS — G4489 Other headache syndrome: Secondary | ICD-10-CM | POA: Diagnosis not present

## 2023-06-25 DIAGNOSIS — I119 Hypertensive heart disease without heart failure: Secondary | ICD-10-CM | POA: Diagnosis not present

## 2023-06-25 DIAGNOSIS — Z6837 Body mass index (BMI) 37.0-37.9, adult: Secondary | ICD-10-CM | POA: Diagnosis not present

## 2023-06-25 DIAGNOSIS — K1230 Oral mucositis (ulcerative), unspecified: Secondary | ICD-10-CM | POA: Diagnosis not present

## 2023-07-09 DIAGNOSIS — G4489 Other headache syndrome: Secondary | ICD-10-CM | POA: Diagnosis not present

## 2023-07-09 DIAGNOSIS — K1230 Oral mucositis (ulcerative), unspecified: Secondary | ICD-10-CM | POA: Diagnosis not present

## 2023-07-09 DIAGNOSIS — I119 Hypertensive heart disease without heart failure: Secondary | ICD-10-CM | POA: Diagnosis not present

## 2023-07-09 DIAGNOSIS — H65192 Other acute nonsuppurative otitis media, left ear: Secondary | ICD-10-CM | POA: Diagnosis not present

## 2023-10-06 DIAGNOSIS — M792 Neuralgia and neuritis, unspecified: Secondary | ICD-10-CM | POA: Diagnosis not present

## 2023-11-02 ENCOUNTER — Other Ambulatory Visit: Payer: Self-pay | Admitting: Internal Medicine

## 2023-11-02 DIAGNOSIS — N6452 Nipple discharge: Secondary | ICD-10-CM

## 2023-11-10 ENCOUNTER — Ambulatory Visit
Admission: RE | Admit: 2023-11-10 | Discharge: 2023-11-10 | Disposition: A | Discharge status: Home / Self Care | Payer: BC Managed Care – PPO | Source: Ambulatory Visit | Attending: Internal Medicine | Admitting: Internal Medicine

## 2023-11-10 ENCOUNTER — Other Ambulatory Visit: Payer: Self-pay | Admitting: Internal Medicine

## 2023-11-10 DIAGNOSIS — N6452 Nipple discharge: Secondary | ICD-10-CM

## 2023-11-10 DIAGNOSIS — N6320 Unspecified lump in the left breast, unspecified quadrant: Secondary | ICD-10-CM

## 2023-11-10 DIAGNOSIS — N6042 Mammary duct ectasia of left breast: Secondary | ICD-10-CM | POA: Diagnosis not present

## 2023-11-12 DIAGNOSIS — M5030 Other cervical disc degeneration, unspecified cervical region: Secondary | ICD-10-CM | POA: Diagnosis not present

## 2023-11-12 DIAGNOSIS — M4802 Spinal stenosis, cervical region: Secondary | ICD-10-CM | POA: Diagnosis not present

## 2023-11-16 ENCOUNTER — Other Ambulatory Visit: Payer: BC Managed Care – PPO

## 2023-11-17 ENCOUNTER — Ambulatory Visit
Admission: RE | Admit: 2023-11-17 | Discharge: 2023-11-17 | Disposition: A | Discharge status: Home / Self Care | Source: Ambulatory Visit | Attending: Internal Medicine | Admitting: Internal Medicine

## 2023-11-17 DIAGNOSIS — R92322 Mammographic fibroglandular density, left breast: Secondary | ICD-10-CM | POA: Diagnosis not present

## 2023-11-17 DIAGNOSIS — N6342 Unspecified lump in left breast, subareolar: Secondary | ICD-10-CM | POA: Diagnosis not present

## 2023-11-17 DIAGNOSIS — N6320 Unspecified lump in the left breast, unspecified quadrant: Secondary | ICD-10-CM

## 2023-11-17 DIAGNOSIS — N6452 Nipple discharge: Secondary | ICD-10-CM | POA: Diagnosis not present

## 2023-11-17 HISTORY — PX: BREAST BIOPSY: SHX20

## 2023-11-18 LAB — SURGICAL PATHOLOGY

## 2023-11-26 ENCOUNTER — Other Ambulatory Visit: Payer: Self-pay | Admitting: Internal Medicine

## 2023-11-26 DIAGNOSIS — N6322 Unspecified lump in the left breast, upper inner quadrant: Secondary | ICD-10-CM

## 2023-11-26 DIAGNOSIS — N6452 Nipple discharge: Secondary | ICD-10-CM

## 2023-12-01 ENCOUNTER — Other Ambulatory Visit: Payer: Self-pay | Admitting: Surgery

## 2023-12-01 DIAGNOSIS — D242 Benign neoplasm of left breast: Secondary | ICD-10-CM | POA: Diagnosis not present

## 2023-12-01 DIAGNOSIS — N6452 Nipple discharge: Secondary | ICD-10-CM | POA: Diagnosis not present

## 2023-12-14 ENCOUNTER — Ambulatory Visit
Admission: RE | Admit: 2023-12-14 | Discharge: 2023-12-14 | Disposition: A | Discharge status: Home / Self Care | Source: Ambulatory Visit | Attending: Internal Medicine | Admitting: Internal Medicine

## 2023-12-14 DIAGNOSIS — N6452 Nipple discharge: Secondary | ICD-10-CM

## 2023-12-14 DIAGNOSIS — N6322 Unspecified lump in the left breast, upper inner quadrant: Secondary | ICD-10-CM

## 2023-12-14 DIAGNOSIS — R928 Other abnormal and inconclusive findings on diagnostic imaging of breast: Secondary | ICD-10-CM | POA: Diagnosis not present

## 2023-12-14 DIAGNOSIS — D242 Benign neoplasm of left breast: Secondary | ICD-10-CM | POA: Diagnosis not present

## 2023-12-14 MED ORDER — GADOPICLENOL 0.5 MMOL/ML IV SOLN
6.0000 mL | Freq: Once | INTRAVENOUS | Status: AC | PRN
Start: 2023-12-14 — End: 2023-12-14
  Administered 2023-12-14: 6 mL via INTRAVENOUS

## 2023-12-18 ENCOUNTER — Other Ambulatory Visit: Payer: Self-pay | Admitting: Surgery

## 2023-12-18 DIAGNOSIS — D242 Benign neoplasm of left breast: Secondary | ICD-10-CM

## 2023-12-23 ENCOUNTER — Other Ambulatory Visit: Payer: Self-pay | Admitting: Surgery

## 2023-12-23 DIAGNOSIS — D242 Benign neoplasm of left breast: Secondary | ICD-10-CM

## 2023-12-29 ENCOUNTER — Encounter (HOSPITAL_BASED_OUTPATIENT_CLINIC_OR_DEPARTMENT_OTHER): Payer: Self-pay | Admitting: Surgery

## 2023-12-29 ENCOUNTER — Other Ambulatory Visit: Payer: Self-pay

## 2023-12-29 ENCOUNTER — Encounter (HOSPITAL_BASED_OUTPATIENT_CLINIC_OR_DEPARTMENT_OTHER)
Admission: RE | Admit: 2023-12-29 | Discharge: 2023-12-29 | Disposition: A | Discharge status: Home / Self Care | Source: Ambulatory Visit | Attending: Surgery | Admitting: Surgery

## 2023-12-29 DIAGNOSIS — Z01812 Encounter for preprocedural laboratory examination: Secondary | ICD-10-CM | POA: Diagnosis not present

## 2023-12-29 LAB — BASIC METABOLIC PANEL WITH GFR
Anion gap: 12 (ref 5–15)
BUN: 18 mg/dL (ref 8–23)
CO2: 24 mmol/L (ref 22–32)
Calcium: 9.3 mg/dL (ref 8.9–10.3)
Chloride: 99 mmol/L (ref 98–111)
Creatinine, Ser: 0.78 mg/dL (ref 0.44–1.00)
GFR, Estimated: >60 mL/min (ref >60)
Glucose, Bld: 87 mg/dL (ref 70–99)
Potassium: 3.7 mmol/L (ref 3.5–5.1)
Sodium: 135 mmol/L (ref 135–145)

## 2023-12-29 MED ORDER — ENSURE PRE-SURGERY PO LIQD
296.0000 mL | Freq: Once | ORAL | Status: DC
Start: 2023-12-30 — End: 2024-01-13

## 2023-12-29 MED ORDER — CHLORHEXIDINE GLUCONATE CLOTH 2 % EX PADS: 6.0000 | MEDICATED_PAD | Freq: Once | CUTANEOUS | Status: DC

## 2023-12-29 NOTE — Progress Notes (Signed)

## 2024-01-02 ENCOUNTER — Other Ambulatory Visit

## 2024-01-12 ENCOUNTER — Encounter

## 2024-01-12 ENCOUNTER — Ambulatory Visit
Admission: RE | Admit: 2024-01-12 | Discharge: 2024-01-12 | Disposition: A | Discharge status: Home / Self Care | Source: Ambulatory Visit | Attending: Surgery | Admitting: Surgery

## 2024-01-12 DIAGNOSIS — D242 Benign neoplasm of left breast: Secondary | ICD-10-CM

## 2024-01-12 HISTORY — PX: BREAST BIOPSY: SHX20

## 2024-01-12 NOTE — H&P (Addendum)
 REFERRING PHYSICIAN: Jolan Natal, MD PROVIDER: Debi Fall, MD MRN: [id number] DOB: May 25, 1956  Subjective   Chief Complaint: New Consultation  History of Present Illness: Victoria Craig is a 68 y.o. female who is seen  as an office consultation for evaluation of New Consultation  This is a 68 year old female who started developing spontaneous bloody left nipple discharge a little over a month ago. She also has pain in the breast. She also describes a fullness in the right breast. She underwent mammograms and ultrasound. She had significantly dilated ducts at the 12 to 1 o'clock position of the subareolar left breast. There was also a 5 mm irregular mass. Biopsy of this mass showed an intraductal papilloma. She also has significantly dilated ducts up to 4 mm thickness at the 6 o'clock position of the left breast. Given these findings, MRI has been recommended of both breast. She is still having pain and discharge from the left nipple. There is no family history of breast cancer. She has had multiple surgical procedures with no issues regarding general anesthesia. She does develop a rash to surgical glue.  Review of Systems: A complete review of systems was obtained from the patient. I have reviewed this information and discussed as appropriate with the patient. See HPI as well for other ROS.  ROS   Medical History: Past Medical History:  Diagnosis Date  Anxiety  Hypertension   There is no problem list on file for this patient.  Past Surgical History:  Procedure Laterality Date  CESAREAN SECTION  CHOLECYSTECTOMY  Kidney surgery    No Known Allergies  Current Outpatient Medications on File Prior to Visit  Medication Sig Dispense Refill  cholecalciferol (VITAMIN D3) 1000 unit tablet Take 1,000 Units by mouth once daily  estradiol (DOTTI) patch 0.1 mg/24 hr Place 1 patch onto the skin  rosuvastatin (CRESTOR) 10 MG tablet Take 10 mg by mouth at bedtime  TIADYLT ER 240  mg ER capsule Take 240 mg by mouth once daily  triamterene-hydroCHLOROthiazide (DYAZIDE) 37.5-25 mg capsule Take 1 capsule by mouth once daily  valACYclovir (VALTREX) 1000 MG tablet TAKE 1 TABLET BY MOUTH TWICE A DAY FOR 7 DAYS THEN TAKE 1 TABLET DAILY   No current facility-administered medications on file prior to visit.   Family History  Problem Relation Age of Onset  Coronary Artery Disease (Blocked arteries around heart) Mother  Coronary Artery Disease (Blocked arteries around heart) Father  Stroke Brother    Social History   Tobacco Use  Smoking Status Never  Smokeless Tobacco Never    Social History   Socioeconomic History  Marital status: Married  Tobacco Use  Smoking status: Never  Smokeless tobacco: Never  Substance and Sexual Activity  Alcohol use: Never  Drug use: Never   Social Drivers of Health   Housing Stability: Unknown (12/01/2023)  Housing Stability Vital Sign  Homeless in the Last Year: No   Objective:   Vitals:   BP: 122/80  Pulse: 70  Temp: 36.6 C (97.8 F)  SpO2: 98%  Weight: 88 kg (194 lb)  Height: 149.9 cm (4\' 11" )  PainSc: 2  PainLoc: Breast   Body mass index is 39.18 kg/m.  Physical Exam   She appears well on exam.  There are no palpable masses in either breast.  The nipple areolar complexes are normal.  There is no axillary adenopathy on either side  Labs, Imaging and Diagnostic Testing: I have reviewed her ultrasound, mammograms, and pathology results  Assessment and Plan:  Diagnoses and all orders for this visit:  Bloody discharge from left nipple  Intraductal papilloma of breast, left  Other orders - diazePAM  (VALIUM ) 5 MG tablet; Take 1 tablet (5 mg total) by mouth once for 1 dose   I discussed the findings of the intraductal papilloma of the left breast. I agree that she does need an MRI given the dilated ducts without an intra ductal mass at the 6 position as well on the left breast as well as ongoing pain  and fullness in both her breast. Depending on the findings on MRI we then discussed proceeding with a radioactive seed guided left breast lumpectomy to remove not only the papilloma but also potentially remove the ducts at the 6 o'clock position of the retroareolar left breast. Again, this would be to rule out malignancy especially in light of the irregularity of the borders of the papilloma as well as the bloody nipple discharge. We will be awaiting the results of the MRI and I will call back with these and we will determine then how to proceed from a surgical standpoint. She and her husband understand and agree with the plans

## 2024-01-13 ENCOUNTER — Other Ambulatory Visit: Payer: Self-pay

## 2024-01-13 ENCOUNTER — Ambulatory Visit (HOSPITAL_BASED_OUTPATIENT_CLINIC_OR_DEPARTMENT_OTHER)
Admission: RE | Admit: 2024-01-13 | Discharge: 2024-01-13 | Disposition: A | Discharge status: Home / Self Care | Attending: Surgery | Admitting: Surgery

## 2024-01-13 ENCOUNTER — Ambulatory Visit (HOSPITAL_BASED_OUTPATIENT_CLINIC_OR_DEPARTMENT_OTHER): Admitting: Anesthesiology

## 2024-01-13 ENCOUNTER — Encounter (HOSPITAL_BASED_OUTPATIENT_CLINIC_OR_DEPARTMENT_OTHER)
Admission: RE | Disposition: A | Discharge status: Home / Self Care | Payer: Self-pay | Source: Home / Self Care | Attending: Surgery

## 2024-01-13 ENCOUNTER — Encounter (HOSPITAL_BASED_OUTPATIENT_CLINIC_OR_DEPARTMENT_OTHER): Payer: Self-pay | Admitting: Surgery

## 2024-01-13 ENCOUNTER — Ambulatory Visit
Admission: RE | Admit: 2024-01-13 | Discharge: 2024-01-13 | Disposition: A | Discharge status: Home / Self Care | Source: Ambulatory Visit | Attending: Surgery | Admitting: Surgery

## 2024-01-13 DIAGNOSIS — Z8249 Family history of ischemic heart disease and other diseases of the circulatory system: Secondary | ICD-10-CM | POA: Insufficient documentation

## 2024-01-13 DIAGNOSIS — D242 Benign neoplasm of left breast: Secondary | ICD-10-CM

## 2024-01-13 DIAGNOSIS — N6042 Mammary duct ectasia of left breast: Secondary | ICD-10-CM | POA: Diagnosis not present

## 2024-01-13 DIAGNOSIS — N644 Mastodynia: Secondary | ICD-10-CM | POA: Insufficient documentation

## 2024-01-13 DIAGNOSIS — K219 Gastro-esophageal reflux disease without esophagitis: Secondary | ICD-10-CM | POA: Diagnosis not present

## 2024-01-13 DIAGNOSIS — N6092 Unspecified benign mammary dysplasia of left breast: Secondary | ICD-10-CM | POA: Diagnosis not present

## 2024-01-13 DIAGNOSIS — E669 Obesity, unspecified: Secondary | ICD-10-CM | POA: Diagnosis not present

## 2024-01-13 DIAGNOSIS — N6002 Solitary cyst of left breast: Secondary | ICD-10-CM | POA: Diagnosis not present

## 2024-01-13 DIAGNOSIS — Z79899 Other long term (current) drug therapy: Secondary | ICD-10-CM | POA: Diagnosis not present

## 2024-01-13 DIAGNOSIS — Z6839 Body mass index (BMI) 39.0-39.9, adult: Secondary | ICD-10-CM | POA: Insufficient documentation

## 2024-01-13 DIAGNOSIS — N6012 Diffuse cystic mastopathy of left breast: Secondary | ICD-10-CM | POA: Insufficient documentation

## 2024-01-13 DIAGNOSIS — I10 Essential (primary) hypertension: Secondary | ICD-10-CM | POA: Insufficient documentation

## 2024-01-13 DIAGNOSIS — N6452 Nipple discharge: Secondary | ICD-10-CM | POA: Diagnosis not present

## 2024-01-13 DIAGNOSIS — M1990 Unspecified osteoarthritis, unspecified site: Secondary | ICD-10-CM | POA: Insufficient documentation

## 2024-01-13 HISTORY — PX: BREAST LUMPECTOMY WITH RADIOACTIVE SEED LOCALIZATION: SHX6424

## 2024-01-13 HISTORY — DX: Anxiety disorder, unspecified: F41.9

## 2024-01-13 SURGERY — BREAST LUMPECTOMY WITH RADIOACTIVE SEED LOCALIZATION
Anesthesia: General | Site: Breast | Laterality: Left

## 2024-01-13 MED ORDER — ACETAMINOPHEN 500 MG PO TABS
ORAL_TABLET | ORAL | Status: AC
  Filled 2024-01-13: qty 2

## 2024-01-13 MED ORDER — PROPOFOL 10 MG/ML IV BOLUS
INTRAVENOUS | Status: AC
  Filled 2024-01-13: qty 20

## 2024-01-13 MED ORDER — FENTANYL CITRATE (PF) 100 MCG/2ML IJ SOLN
25.0000 ug | INTRAMUSCULAR | Status: DC | PRN
Start: 2024-01-13 — End: 2024-01-13

## 2024-01-13 MED ORDER — MIDAZOLAM HCL 5 MG/5ML IJ SOLN
INTRAMUSCULAR | Status: DC | PRN
  Administered 2024-01-13: 2 mg via INTRAVENOUS

## 2024-01-13 MED ORDER — LIDOCAINE 2% (20 MG/ML) 5 ML SYRINGE
INTRAMUSCULAR | Status: AC
  Filled 2024-01-13: qty 5

## 2024-01-13 MED ORDER — ONDANSETRON HCL 4 MG/2ML IJ SOLN
INTRAMUSCULAR | Status: DC | PRN
  Administered 2024-01-13: 4 mg via INTRAVENOUS

## 2024-01-13 MED ORDER — CELECOXIB 200 MG PO CAPS: 200.0000 mg | ORAL_CAPSULE | Freq: Once | ORAL | Status: DC

## 2024-01-13 MED ORDER — ACETAMINOPHEN 500 MG PO TABS: 1000.0000 mg | ORAL_TABLET | Freq: Once | ORAL | Status: DC

## 2024-01-13 MED ORDER — ONDANSETRON HCL 4 MG/2ML IJ SOLN: 4.0000 mg | Freq: Once | INTRAMUSCULAR | Status: DC | PRN

## 2024-01-13 MED ORDER — CEFAZOLIN SODIUM-DEXTROSE 2-4 GM/100ML-% IV SOLN
INTRAVENOUS | Status: AC
  Filled 2024-01-13: qty 100

## 2024-01-13 MED ORDER — MIDAZOLAM HCL 2 MG/2ML IJ SOLN
INTRAMUSCULAR | Status: AC
  Filled 2024-01-13: qty 2

## 2024-01-13 MED ORDER — OXYCODONE HCL 5 MG PO TABS
5.0000 mg | ORAL_TABLET | Freq: Once | ORAL | Status: AC | PRN
  Administered 2024-01-13: 5 mg via ORAL

## 2024-01-13 MED ORDER — ONDANSETRON HCL 4 MG/2ML IJ SOLN
INTRAMUSCULAR | Status: AC
  Filled 2024-01-13: qty 2

## 2024-01-13 MED ORDER — PROPOFOL 10 MG/ML IV BOLUS
INTRAVENOUS | Status: DC | PRN
  Administered 2024-01-13: 200 mg via INTRAVENOUS

## 2024-01-13 MED ORDER — OXYCODONE HCL 5 MG PO TABS
ORAL_TABLET | ORAL | Status: AC
  Filled 2024-01-13: qty 1

## 2024-01-13 MED ORDER — OXYCODONE HCL 5 MG PO TABS: 5.0000 mg | ORAL_TABLET | Freq: Four times a day (QID) | ORAL | 0 refills | Status: AC | PRN

## 2024-01-13 MED ORDER — FENTANYL CITRATE (PF) 100 MCG/2ML IJ SOLN
INTRAMUSCULAR | Status: DC | PRN
Start: 2024-01-13 — End: 2024-01-13
  Administered 2024-01-13: 25 ug via INTRAVENOUS

## 2024-01-13 MED ORDER — EPHEDRINE SULFATE (PRESSORS) 50 MG/ML IJ SOLN
INTRAMUSCULAR | Status: DC | PRN
  Administered 2024-01-13: 5 mg via INTRAVENOUS

## 2024-01-13 MED ORDER — DEXAMETHASONE SODIUM PHOSPHATE 10 MG/ML IJ SOLN
INTRAMUSCULAR | Status: AC
  Filled 2024-01-13: qty 1

## 2024-01-13 MED ORDER — ACETAMINOPHEN 500 MG PO TABS
1000.0000 mg | ORAL_TABLET | ORAL | Status: AC
  Administered 2024-01-13: 1000 mg via ORAL

## 2024-01-13 MED ORDER — BUPIVACAINE-EPINEPHRINE 0.5% -1:200000 IJ SOLN
INTRAMUSCULAR | Status: DC | PRN
Start: 2024-01-13 — End: 2024-01-13
  Administered 2024-01-13: 16 mL

## 2024-01-13 MED ORDER — DEXAMETHASONE SODIUM PHOSPHATE 10 MG/ML IJ SOLN
INTRAMUSCULAR | Status: DC | PRN
  Administered 2024-01-13: 5 mg via INTRAVENOUS

## 2024-01-13 MED ORDER — OXYCODONE HCL 5 MG/5ML PO SOLN: 5.0000 mg | Freq: Once | ORAL | Status: AC | PRN

## 2024-01-13 MED ORDER — FENTANYL CITRATE (PF) 100 MCG/2ML IJ SOLN
INTRAMUSCULAR | Status: AC
Start: 2024-01-13 — End: ?
  Filled 2024-01-13: qty 2

## 2024-01-13 MED ORDER — EPHEDRINE 5 MG/ML INJ
INTRAVENOUS | Status: AC
  Filled 2024-01-13: qty 5

## 2024-01-13 MED ORDER — LACTATED RINGERS IV SOLN: INTRAVENOUS | Status: DC

## 2024-01-13 MED ORDER — CEFAZOLIN SODIUM-DEXTROSE 2-4 GM/100ML-% IV SOLN
2.0000 g | INTRAVENOUS | Status: AC
  Administered 2024-01-13: 2 g via INTRAVENOUS

## 2024-01-13 MED ORDER — LIDOCAINE HCL (CARDIAC) PF 100 MG/5ML IV SOSY
PREFILLED_SYRINGE | INTRAVENOUS | Status: DC | PRN
  Administered 2024-01-13: 60 mg via INTRAVENOUS

## 2024-01-13 SURGICAL SUPPLY — 38 items
BINDER BREAST 3XL (GAUZE/BANDAGES/DRESSINGS) IMPLANT
BINDER BREAST LRG (GAUZE/BANDAGES/DRESSINGS) IMPLANT
BINDER BREAST MEDIUM (GAUZE/BANDAGES/DRESSINGS) IMPLANT
BINDER BREAST XLRG (GAUZE/BANDAGES/DRESSINGS) IMPLANT
BINDER BREAST XXLRG (GAUZE/BANDAGES/DRESSINGS) IMPLANT
BLADE SURG 15 STRL LF DISP TIS (BLADE) ×1 IMPLANT
CANISTER SUC SOCK COL 7IN (MISCELLANEOUS) IMPLANT
CANISTER SUCT 1200ML W/VALVE (MISCELLANEOUS) IMPLANT
CHLORAPREP W/TINT 26 (MISCELLANEOUS) ×1 IMPLANT
CLIP APPLIE 9.375 MED OPEN (MISCELLANEOUS) IMPLANT
COVER BACK TABLE 60X90IN (DRAPES) ×1 IMPLANT
COVER MAYO STAND STRL (DRAPES) ×1 IMPLANT
COVER PROBE CYLINDRICAL 5X96 (MISCELLANEOUS) ×1 IMPLANT
DERMABOND ADVANCED .7 DNX12 (GAUZE/BANDAGES/DRESSINGS) ×1 IMPLANT
DRAPE LAPAROSCOPIC ABDOMINAL (DRAPES) ×1 IMPLANT
DRAPE UTILITY XL STRL (DRAPES) ×1 IMPLANT
ELECTRODE REM PT RTRN 9FT ADLT (ELECTROSURGICAL) ×1 IMPLANT
GAUZE SPONGE 4X4 12PLY STRL LF (GAUZE/BANDAGES/DRESSINGS) IMPLANT
GLOVE SURG SIGNA 7.5 PF LTX (GLOVE) ×1 IMPLANT
GOWN STRL REUS W/ TWL LRG LVL3 (GOWN DISPOSABLE) ×1 IMPLANT
GOWN STRL REUS W/ TWL XL LVL3 (GOWN DISPOSABLE) ×1 IMPLANT
KIT MARKER MARGIN INK (KITS) ×1 IMPLANT
NDL HYPO 25X1 1.5 SAFETY (NEEDLE) ×1 IMPLANT
NEEDLE HYPO 25X1 1.5 SAFETY (NEEDLE) ×1 IMPLANT
NS IRRIG 1000ML POUR BTL (IV SOLUTION) IMPLANT
PACK BASIN DAY SURGERY FS (CUSTOM PROCEDURE TRAY) ×1 IMPLANT
PENCIL SMOKE EVACUATOR (MISCELLANEOUS) ×1 IMPLANT
SLEEVE SCD COMPRESS KNEE MED (STOCKING) ×1 IMPLANT
SPIKE FLUID TRANSFER (MISCELLANEOUS) IMPLANT
SPONGE T-LAP 4X18 ~~LOC~~+RFID (SPONGE) ×1 IMPLANT
SUT MNCRL AB 4-0 PS2 18 (SUTURE) ×1 IMPLANT
SUT SILK 2 0 SH (SUTURE) IMPLANT
SUT VIC AB 3-0 SH 27X BRD (SUTURE) ×1 IMPLANT
SYR CONTROL 10ML LL (SYRINGE) ×1 IMPLANT
TOWEL GREEN STERILE FF (TOWEL DISPOSABLE) ×1 IMPLANT
TRAY FAXITRON CT DISP (TRAY / TRAY PROCEDURE) ×1 IMPLANT
TUBE CONNECTING 20X1/4 (TUBING) IMPLANT
YANKAUER SUCT BULB TIP NO VENT (SUCTIONS) IMPLANT

## 2024-01-13 NOTE — Discharge Instructions (Addendum)
 Post Anesthesia Home Care Instructions  Activity: Get plenty of rest for the remainder of the day. A responsible individual must stay with you for 24 hours following the procedure.  For the next 24 hours, DO NOT: -Drive a car -Advertising copywriter -Drink alcoholic beverages -Take any medication unless instructed by your physician -Make any legal decisions or sign important papers.  Meals: Start with liquid foods such as gelatin or soup. Progress to regular foods as tolerated. Avoid greasy, spicy, heavy foods. If nausea and/or vomiting occur, drink only clear liquids until the nausea and/or vomiting subsides. Call your physician if vomiting continues.  Special Instructions/Symptoms: Your throat may feel dry or sore from the anesthesia or the breathing tube placed in your throat during surgery. If this causes discomfort, gargle with warm salt water. The discomfort should disappear within 24 hours.  If you had a scopolamine  patch placed behind your ear for the management of post- operative nausea and/or vomiting:  1. The medication in the patch is effective for 72 hours, after which it should be removed.  Wrap patch in a tissue and discard in the trash. Wash hands thoroughly with soap and water. 2. You may remove the patch earlier than 72 hours if you experience unpleasant side effects which may include dry mouth, dizziness or visual disturbances. 3. Avoid touching the patch. Wash your hands with soap and water after contact with the patch.     Next dose of Tylenol  may be given at 4pm if needed. Oxycodone  given at 1:35pm .   [facility] Office Phone Number [phone]  BREAST BIOPSY/ PARTIAL MASTECTOMY: POST OP INSTRUCTIONS  Always review your discharge instruction sheet given to you by the facility where your surgery was performed.  IF YOU HAVE DISABILITY OR FAMILY LEAVE FORMS, YOU MUST BRING THEM TO THE OFFICE FOR PROCESSING.  DO NOT GIVE THEM TO YOUR DOCTOR.  A  prescription for pain medication may be given to you upon discharge.  Take your pain medication as prescribed, if needed.  If narcotic pain medicine is not needed, then you may take acetaminophen  (Tylenol ) or ibuprofen  (Advil ) as needed. Take your usually prescribed medications unless otherwise directed If you need a refill on your pain medication, please contact your pharmacy.  They will contact our office to request authorization.  Prescriptions will not be filled after 5pm or on week-ends. You should eat very light the first 24 hours after surgery, such as soup, crackers, pudding, etc.  Resume your normal diet the day after surgery. Most patients will experience some swelling and bruising in the breast.  Ice packs and a good support bra will help.  Swelling and bruising can take several days to resolve.  It is common to experience some constipation if taking pain medication after surgery.  Increasing fluid intake and taking a stool softener will usually help or prevent this problem from occurring.  A mild laxative (Milk of Magnesia or Miralax) should be taken according to package directions if there are no bowel movements after 48 hours. Unless discharge instructions indicate otherwise, you may remove your bandages 24-48 hours after surgery, and you may shower at that time.  You may have steri-strips (small skin tapes) in place directly over the incision.  These strips should be left on the skin for 7-10 days.  If your surgeon used skin glue on the incision, you may shower in 24 hours.  The glue will flake off over the next 2-3 weeks.  Any sutures or staples will  be removed at the office during your follow-up visit. ACTIVITIES:  You may resume regular daily activities (gradually increasing) beginning the next day.  Wearing a good support bra or sports bra minimizes pain and swelling.  You may have sexual intercourse when it is comfortable. You may drive when you no longer are taking prescription pain  medication, you can comfortably wear a seatbelt, and you can safely maneuver your car and apply brakes. RETURN TO WORK:  ______________________________________________________________________________________ Elene Griffes should see your doctor in the office for a follow-up appointment approximately two weeks after your surgery.  Your doctor's nurse will typically make your follow-up appointment when she calls you with your pathology report.  Expect your pathology report 2-3 business days after your surgery.  You may call to check if you do not hear from us  after three days. OTHER INSTRUCTIONS: YOU MAY SHOWER STARTING TOMORROW ICE PACK, TYLENOL , AND IBUPROFEN  ALSO FOR PAIN NO VIGOROUS ACTIVITY FOR ONE WEEK _______________________________________________________________________________________________ _____________________________________________________________________________________________________________________________________ _____________________________________________________________________________________________________________________________________ _____________________________________________________________________________________________________________________________________  WHEN TO CALL YOUR DOCTOR: Fever over 101.0 Nausea and/or vomiting. Extreme swelling or bruising. Continued bleeding from incision. Increased pain, redness, or drainage from the incision.  The clinic staff is available to answer your questions during regular business hours.  Please don't hesitate to call and ask to speak to one of the nurses for clinical concerns.  If you have a medical emergency, go to the nearest emergency room or call 911.  A surgeon from [facility] is always on call at the hospital.  For further questions, please visit [email or web address]

## 2024-01-13 NOTE — Transfer of Care (Signed)
 Immediate Anesthesia Transfer of Care Note  Patient: Victoria Craig  Procedure(s) Performed: BREAST LUMPECTOMY WITH RADIOACTIVE SEED LOCALIZATION (Left: Breast)  Patient Location: PACU  Anesthesia Type:General  Level of Consciousness: drowsy  Airway & Oxygen Therapy: Patient Spontanous Breathing and Patient connected to face mask oxygen  Post-op Assessment: Report given to RN and Post -op Vital signs reviewed and stable  Post vital signs: Reviewed and stable  Last Vitals:  Vitals Value Taken Time  BP 99/56 (70)   Temp    Pulse 79   Resp 13   SpO2 100     Last Pain:  Vitals:   01/13/24 0956  TempSrc: Temporal  PainSc: 0-No pain         Complications: No notable events documented.

## 2024-01-13 NOTE — Anesthesia Preprocedure Evaluation (Addendum)
 Anesthesia Evaluation  Patient identified by MRN, date of birth, ID band Patient awake    Reviewed: Allergy & Precautions, NPO status , Patient's Chart, lab work & pertinent test results  History of Anesthesia Complications Negative for: history of anesthetic complications  Airway Mallampati: II  TM Distance: >3 FB Neck ROM: Full    Dental  (+) Dental Advisory Given, Teeth Intact   Pulmonary neg pulmonary ROS   Pulmonary exam normal        Cardiovascular hypertension, Pt. on medications Normal cardiovascular exam   '19 Myoperfusion -  The left ventricular ejection fraction is hyperdynamic (>65%).  Nuclear stress EF: 74%.  There was no ST segment deviation noted during stress.  The study is normal.  This is a low risk study.    Neuro/Psych  PSYCHIATRIC DISORDERS Anxiety Depression    negative neurological ROS     GI/Hepatic Neg liver ROS,GERD  Controlled,,  Endo/Other   Obesity   Renal/GU negative Renal ROS     Musculoskeletal  (+) Arthritis ,    Abdominal   Peds  Hematology negative hematology ROS (+)   Anesthesia Other Findings   Reproductive/Obstetrics  Intraductal papilloma of breast, left                               Anesthesia Physical Anesthesia Plan  ASA: 2  Anesthesia Plan: General   Post-op Pain Management: Tylenol  PO (pre-op)* and Celebrex PO (pre-op)*   Induction: Intravenous  PONV Risk Score and Plan: 3 and Treatment may vary due to age or medical condition, Ondansetron , Dexamethasone  and Midazolam   Airway Management Planned: LMA  Additional Equipment: None  Intra-op Plan:   Post-operative Plan: Extubation in OR  Informed Consent: I have reviewed the patients History and Physical, chart, labs and discussed the procedure including the risks, benefits and alternatives for the proposed anesthesia with the patient or authorized representative who has  indicated his/her understanding and acceptance.     Dental advisory given  Plan Discussed with: CRNA and Anesthesiologist  Anesthesia Plan Comments:        Anesthesia Quick Evaluation

## 2024-01-13 NOTE — Anesthesia Procedure Notes (Signed)
 Procedure Name: LMA Insertion Date/Time: 01/13/2024 12:08 PM  Performed by: Noralyn Beams, CRNAPre-anesthesia Checklist: Patient identified, Emergency Drugs available, Suction available and Patient being monitored Patient Re-evaluated:Patient Re-evaluated prior to induction Oxygen Delivery Method: Circle system utilized Preoxygenation: Pre-oxygenation with 100% oxygen Induction Type: IV induction Ventilation: Mask ventilation without difficulty LMA: LMA inserted LMA Size: 4.0 Number of attempts: 1 Airway Equipment and Method: Bite block Placement Confirmation: positive ETCO2 Tube secured with: Tape Dental Injury: Teeth and Oropharynx as per pre-operative assessment

## 2024-01-13 NOTE — Interval H&P Note (Signed)
 History and Physical Interval Note:no change in H and P  01/13/2024 11:15 AM  Victoria Craig  has presented today for surgery, with the diagnosis of LEFT BREAST INTRADUCTAL PAPILLOMA.  The various methods of treatment have been discussed with the patient and family. After consideration of risks, benefits and other options for treatment, the patient has consented to  Procedure(s) with comments: BREAST LUMPECTOMY WITH RADIOACTIVE SEED LOCALIZATION (Left) - LMA RADIOACTIVE SEED GUIDED LEFT BREAST LUMPECTOMY as a surgical intervention.  The patient's history has been reviewed, patient examined, no change in status, stable for surgery.  I have reviewed the patient's chart and labs.  Questions were answered to the patient's satisfaction.     Oza Blumenthal

## 2024-01-13 NOTE — Op Note (Signed)
  Victoria Craig 01/13/2024   Pre-op Diagnosis: LEFT BREAST INTRADUCTAL PAPILLOMA     Post-op Diagnosis: same  Procedure(s): RADIOACTIVE SEED GUIDED EXCISION LEFT BREAST MASS  Surgeon(s): Oza Blumenthal, MD  Anesthesia: General  Staff:  Circulator: Ricke Charleston, RN; Maureen Sour, RN Scrub Person: Ardean Kohl, CST  Estimated Blood Loss: Minimal               Specimens: SENT TO PATH  Indications: This is a 68 year old female who presented with nipple discharge.  This was on the left side.  Imaging demonstrated a small intraductal mass which was biopsied showing intraductal papilloma.  The decision was made to proceed with a radioactive seed guided excision of the right breast mass/papilloma  Procedure: The patient was brought to the op room and identified the correct patient.  She was placed upon the operating table and general anesthesia was induced.  Her left breast was prepped and draped in usual sterile fashion.  Using the neoprobe I located the radioactive seed at the 3 o'clock position of the left breast underneath the areola.  I anesthetized the lateral edge of the areola with Marcaine  and made a circumareolar incision with a scalpel.  I then dissected down to the breast tissue with electrocautery.  With a the neoprobe I then stayed widely around the signal from the radioactive seed in all directions dissecting down to the breast tissue with electrocautery.  I then grabbed the specimen and elevated it in the completed the dissection with the electrocautery staying underneath the signal.  Once the specimen was removed with the surrounding breast tissue I marked all margins with paint.  An x-ray was performed confirming the radioactive seed and previous biopsy clip were in the specimen.  The specimen was then sent to pathology for evaluation.  Hemostasis was achieved with the cautery.  I then closed the subcutaneous tissue with interrupted 3-0 Vicryl sutures and  closed the skin with a running 4-0 Monocryl.  Benzoin, Steri-Strips, gauze, and tape were then applied.  The patient tolerated the procedure well.  All counts were correct at the end of the procedure.  The patient was then extubated in the operating room and taken in a stable condition to the recovery room.          Oza Blumenthal   Date: 01/13/2024  Time: 12:36 PM

## 2024-01-13 NOTE — Anesthesia Postprocedure Evaluation (Signed)
 Anesthesia Post Note  Patient: Victoria Craig  Procedure(s) Performed: BREAST LUMPECTOMY WITH RADIOACTIVE SEED LOCALIZATION (Left: Breast)     Patient location during evaluation: PACU Anesthesia Type: General Level of consciousness: awake and alert Pain management: pain level controlled Vital Signs Assessment: post-procedure vital signs reviewed and stable Respiratory status: spontaneous breathing, nonlabored ventilation and respiratory function stable Cardiovascular status: stable and blood pressure returned to baseline Anesthetic complications: no  No notable events documented.  Last Vitals:  Vitals:   01/13/24 1315 01/13/24 1345  BP: 115/69 107/74  Pulse: 72 67  Resp: 18 16  Temp:  36.5 C  SpO2: 95% 97%    Last Pain:  Vitals:   01/13/24 1332  TempSrc:   PainSc: 6                  Juventino Oppenheim

## 2024-01-14 ENCOUNTER — Encounter (HOSPITAL_BASED_OUTPATIENT_CLINIC_OR_DEPARTMENT_OTHER): Payer: Self-pay | Admitting: Surgery

## 2024-01-15 LAB — SURGICAL PATHOLOGY

## 2024-04-14 DIAGNOSIS — E782 Mixed hyperlipidemia: Secondary | ICD-10-CM | POA: Diagnosis not present

## 2024-04-14 DIAGNOSIS — Z6839 Body mass index (BMI) 39.0-39.9, adult: Secondary | ICD-10-CM | POA: Diagnosis not present

## 2024-04-14 DIAGNOSIS — G8928 Other chronic postprocedural pain: Secondary | ICD-10-CM | POA: Diagnosis not present

## 2024-04-14 DIAGNOSIS — R7303 Prediabetes: Secondary | ICD-10-CM | POA: Diagnosis not present

## 2024-04-14 DIAGNOSIS — I119 Hypertensive heart disease without heart failure: Secondary | ICD-10-CM | POA: Diagnosis not present

## 2024-04-14 DIAGNOSIS — H65192 Other acute nonsuppurative otitis media, left ear: Secondary | ICD-10-CM | POA: Diagnosis not present

## 2024-05-02 DIAGNOSIS — N6452 Nipple discharge: Secondary | ICD-10-CM | POA: Diagnosis not present

## 2024-05-17 DIAGNOSIS — E782 Mixed hyperlipidemia: Secondary | ICD-10-CM | POA: Diagnosis not present

## 2024-05-17 DIAGNOSIS — R7303 Prediabetes: Secondary | ICD-10-CM | POA: Diagnosis not present

## 2024-05-17 DIAGNOSIS — I119 Hypertensive heart disease without heart failure: Secondary | ICD-10-CM | POA: Diagnosis not present

## 2024-06-23 DIAGNOSIS — I119 Hypertensive heart disease without heart failure: Secondary | ICD-10-CM | POA: Diagnosis not present

## 2024-06-23 DIAGNOSIS — R0789 Other chest pain: Secondary | ICD-10-CM | POA: Diagnosis not present

## 2024-06-23 DIAGNOSIS — E782 Mixed hyperlipidemia: Secondary | ICD-10-CM | POA: Diagnosis not present

## 2024-06-23 DIAGNOSIS — B002 Herpesviral gingivostomatitis and pharyngotonsillitis: Secondary | ICD-10-CM | POA: Diagnosis not present

## 2024-07-21 DIAGNOSIS — N200 Calculus of kidney: Secondary | ICD-10-CM | POA: Diagnosis not present

## 2024-09-26 ENCOUNTER — Ambulatory Visit: Admitting: Cardiovascular Disease
